# Patient Record
Sex: Female | Born: 1995 | Race: White | Hispanic: No | Marital: Single | Smoking: Current every day smoker
Health system: Southern US, Community
[De-identification: ages and names within clinical notes are randomized; demographics above are authoritative.]

## PROBLEM LIST (undated history)

## (undated) ENCOUNTER — Inpatient Hospital Stay: Payer: Self-pay

## (undated) DIAGNOSIS — Z789 Other specified health status: Secondary | ICD-10-CM

## (undated) HISTORY — PX: NO PAST SURGERIES: SHX2092

---
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
## 2016-07-26 NOTE — L&D Delivery Note (Signed)
Date of delivery: 02/24/17 Estimated Date of Delivery: 04/22/17 Patient's last menstrual period was 07/16/2016 (exact date). EGA: [id number]  Delivery Note At 7:54 AM a viable female was delivered via Vaginal, Spontaneous Delivery (Presentation: cephalic; direct OA).  APGAR: 8, 8; weight 4 lb 2 oz (1870 g).   Placenta status: spontaneous, intact.  Cord: 3vv  with the following complications: none apparent.  Cord pH: not collected  Anesthesia:  epidural Episiotomy: None Lacerations:  none Suture Repair: n/a Est. Blood Loss (mL):  42cc0 measured  Mom presented to L&D for induction of labor due to severe growth restriction.  Cervical ripening with cytotec, then augmented with pitocin.  epidual placed. Progressed to complete, second stage: <6310min, with delivery of fetal head without restitution.   Anterior then posterior shoulders delivered without difficulty.  Baby held below level of placenta for >60sec. She was then placed on mom's chest, and attended to by peds.  Cord was then clamped and cut by FOB.  Placenta spontaneously delivered, intact.   IV pitocin given for hemorrhage prophylaxis.  We sang happy birthday to baby Journey.  Mom to postpartum.  Baby to NICU (protocol due to weight)  Semir Brill C Madalena Kesecker 03/27/2017, 8:22 AM

## 2016-09-24 ENCOUNTER — Emergency Department
Admission: EM | Admit: 2016-09-24 | Discharge: 2016-09-24 | Disposition: A | Discharge status: Home / Self Care | Payer: Medicaid Other | Attending: Emergency Medicine | Admitting: Emergency Medicine

## 2016-09-24 ENCOUNTER — Emergency Department: Payer: Medicaid Other

## 2016-09-24 ENCOUNTER — Encounter: Payer: Self-pay | Admitting: Emergency Medicine

## 2016-09-24 DIAGNOSIS — Z3A09 9 weeks gestation of pregnancy: Secondary | ICD-10-CM | POA: Diagnosis not present

## 2016-09-24 DIAGNOSIS — R1032 Left lower quadrant pain: Secondary | ICD-10-CM

## 2016-09-24 DIAGNOSIS — Z79899 Other long term (current) drug therapy: Secondary | ICD-10-CM | POA: Diagnosis not present

## 2016-09-24 DIAGNOSIS — F17210 Nicotine dependence, cigarettes, uncomplicated: Secondary | ICD-10-CM | POA: Insufficient documentation

## 2016-09-24 DIAGNOSIS — O99331 Smoking (tobacco) complicating pregnancy, first trimester: Secondary | ICD-10-CM | POA: Insufficient documentation

## 2016-09-24 DIAGNOSIS — O26891 Other specified pregnancy related conditions, first trimester: Secondary | ICD-10-CM | POA: Diagnosis present

## 2016-09-24 DIAGNOSIS — N390 Urinary tract infection, site not specified: Secondary | ICD-10-CM

## 2016-09-24 DIAGNOSIS — Z791 Long term (current) use of non-steroidal anti-inflammatories (NSAID): Secondary | ICD-10-CM | POA: Diagnosis not present

## 2016-09-24 DIAGNOSIS — O2341 Unspecified infection of urinary tract in pregnancy, first trimester: Secondary | ICD-10-CM | POA: Insufficient documentation

## 2016-09-24 DIAGNOSIS — R102 Pelvic and perineal pain: Secondary | ICD-10-CM | POA: Diagnosis not present

## 2016-09-24 DIAGNOSIS — Z3490 Encounter for supervision of normal pregnancy, unspecified, unspecified trimester: Secondary | ICD-10-CM

## 2016-09-24 LAB — HCG, QUANTITATIVE, PREGNANCY: hCG, Beta Chain, Quant, S: 123464 m[IU]/mL — ABNORMAL HIGH (ref <5)

## 2016-09-24 LAB — URINALYSIS, COMPLETE (UACMP) WITH MICROSCOPIC
BACTERIA UA: NONE SEEN
BILIRUBIN URINE: NEGATIVE
Glucose, UA: NEGATIVE mg/dL
HGB URINE DIPSTICK: NEGATIVE
Ketones, ur: NEGATIVE mg/dL
Nitrite: NEGATIVE
PROTEIN: NEGATIVE mg/dL
RBC / HPF: NONE SEEN RBC/hpf (ref 0–5)
SPECIFIC GRAVITY, URINE: 1.006 (ref 1.005–1.030)
pH: 7 (ref 5.0–8.0)

## 2016-09-24 LAB — POCT PREGNANCY, URINE: Preg Test, Ur: POSITIVE — AB

## 2016-09-24 MED ORDER — NITROFURANTOIN MONOHYD MACRO 100 MG PO CAPS: 100.0000 mg | ORAL_CAPSULE | Freq: Two times a day (BID) | ORAL | 0 refills | Status: AC

## 2016-09-24 NOTE — ED Provider Notes (Signed)
[facility] Emergency Department Provider Note    ____________________________________________   I have reviewed the triage vital signs and the nursing notes.   HISTORY  Chief Complaint Abdominal Pain   History limited by: Not Limited   HPI Holly Watson is a 21 y.o. female who presents to the emergency department today because of concerns for left adnexal pain in the setting of early pregnancy. Patient had her last menstrual cycle and the end of December. States that she first took a positive pregnancy test two weeks ago. For the past few days has been having left lower quadrant pain. She has not had any vaginal bleeding or abnormal vaginal discharge. She denies any dysuria or change in urination. Sent from PCP for concern for ectopic.    History reviewed. No pertinent past medical history.  Patient Active Problem List   Diagnosis Date Noted  . Oligohydramnios 07/10/2015  . Poor fetal growth affecting management of mother in third trimester, antepartum 06/30/2015  . Indication for care in labor and delivery, antepartum 06/29/2015  . Labor and delivery indication for care or intervention 06/27/2015    History reviewed. No pertinent surgical history.  Prior to Admission medications   Medication Sig Start Date End Date Taking? Authorizing Provider  clindamycin (CLEOCIN) 300 MG capsule Take 1 capsule (300 mg total) by mouth 3 (three) times daily. 07/23/15   Rebecka Apley, MD  ibuprofen (ADVIL,MOTRIN) 600 MG tablet Take 1 tablet (600 mg total) by mouth every 6 (six) hours. 07/12/15   Karena Addison, CNM  oxyCODONE-acetaminophen (PERCOCET/ROXICET) 5-325 MG tablet Take 1 tablet by mouth every 4 (four) hours as needed (for pain scale 4-7). 07/12/15   Karena Addison, CNM  oxyCODONE-acetaminophen (ROXICET) 5-325 MG tablet Take 1 tablet by mouth every 6 (six) hours as needed. 07/23/15   Rebecka Apley, MD  Prenatal Vit-Fe Fumarate-FA (PRENATAL  MULTIVITAMIN) TABS tablet Take 1 tablet by mouth daily at 12 noon.    Historical Provider, MD    Allergies Patient has no known allergies.  No family history on file.  Social History Social History  Substance Use Topics  . Smoking status: Current Every Day Smoker    Packs/day: 0.50    Types: Cigarettes  . Smokeless tobacco: Never Used  . Alcohol use No    Review of Systems  Constitutional: Negative for fever. Cardiovascular: Negative for chest pain. Respiratory: Negative for shortness of breath. Gastrointestinal: Positive for LLQ pain. Genitourinary: Negative for dysuria. Musculoskeletal: Negative for back pain. Skin: Negative for rash. Neurological: Negative for headaches, focal weakness or numbness.  10-point ROS otherwise negative.  ____________________________________________   PHYSICAL EXAM:  VITAL SIGNS: ED Triage Vitals [09/24/16 1428]  Enc Vitals Group     BP 112/77     Pulse Rate 90     Resp 18     Temp 98.7 F (37.1 C)     Temp Source Oral     SpO2 100 %     Weight 130 lb (59 kg)     Height 5\' 3"  (1.6 m)     Head Circumference     Constitutional: Alert and oriented. Well appearing and in no distress. Eyes: Conjunctivae are normal. Normal extraocular movements. ENT   Head: Normocephalic and atraumatic.   Nose: No congestion/rhinnorhea.   Mouth/Throat: Mucous membranes are moist.   Neck: No stridor. Hematological/Lymphatic/Immunilogical: No cervical lymphadenopathy. Cardiovascular: Normal rate, regular rhythm.  No murmurs, rubs, or gallops.  Respiratory: Normal respiratory effort without tachypnea  nor retractions. Breath sounds are clear and equal bilaterally. No wheezes/rales/rhonchi. Gastrointestinal: Soft and minimally tender in the LLQ. No rebound. No guarding. Genitourinary: Deferred Musculoskeletal: Normal range of motion in all extremities. No lower extremity edema. Neurologic:  Normal speech and language. No gross focal  neurologic deficits are appreciated.  Skin:  Skin is warm, dry and intact. No rash noted. Psychiatric: Mood and affect are normal. Speech and behavior are normal. Patient exhibits appropriate insight and judgment.  ____________________________________________    LABS (pertinent positives/negatives)  Labs Reviewed  URINALYSIS, COMPLETE (UACMP) WITH MICROSCOPIC - Abnormal; Notable for the following:       Result Value   Color, Urine STRAW (*)    APPearance CLEAR (*)    Leukocytes, UA TRACE (*)    Squamous Epithelial / LPF 6-30 (*)    All other components within normal limits  HCG, QUANTITATIVE, PREGNANCY - Abnormal; Notable for the following:    hCG, Beta Chain, Quant, S [id number] (*)    All other components within normal limits  POCT PREGNANCY, URINE - Abnormal; Notable for the following:    Preg Test, Ur POSITIVE (*)    All other components within normal limits     ____________________________________________   EKG  None  ____________________________________________    RADIOLOGY  US IMPRESSION:  1. Single living intrauterine pregnancy as above.  2. No evidence of ovarian torsion.     ____________________________________________   PROCEDURES  Procedures  ____________________________________________   INITIAL IMPRESSION / ASSESSMENT AND PLAN / ED COURSE  Pertinent labs & imaging results that were available during my care of the patient were reviewed by me and considered in my medical decision making (see chart for details).  Patient presented to the emergency department today because of concerns for left lower abdominal pain in the setting of early pregnancy. Ultrasound shows a single live intrauterine pregnancy. The patient's urine however did have some elevation of white blood cells raising concern for possible urinary tract infection. Will plan on starting patient on antibiotics. Did discuss importance of starting prenatal vitamins with  patient.  ____________________________________________   FINAL CLINICAL IMPRESSION(S) / ED DIAGNOSES  Final diagnoses:  Adnexal pain  Lower urinary tract infectious disease  Pregnancy, unspecified gestational age     Note: This dictation was prepared with Nurse, children'sDragon dictation. Any transcriptional errors that result from this process are unintentional     Phineas SemenGraydon Neka Bise, MD 09/24/16 1709

## 2016-09-24 NOTE — ED Triage Notes (Signed)
Pt complains of sharp pains to lower abdomen on left side for 3 to 4 days. Pt states she was seen at urgent care earlier today and was told she was pregnant. Pt states they wanted her to follow up for an ultrasound because of the tenderness to her abdomen.

## 2016-09-24 NOTE — Discharge Instructions (Signed)
Please start taking prenatal vitamins. Please seek medical attention for any high fevers, chest pain, shortness of breath, change in behavior, persistent vomiting, bloody stool or any other new or concerning symptoms.  

## 2017-01-11 ENCOUNTER — Other Ambulatory Visit: Payer: Self-pay | Admitting: Obstetrics and Gynecology

## 2017-01-11 DIAGNOSIS — Z3689 Encounter for other specified antenatal screening: Secondary | ICD-10-CM

## 2017-01-17 ENCOUNTER — Other Ambulatory Visit: Payer: Self-pay | Admitting: *Deleted

## 2017-01-20 ENCOUNTER — Ambulatory Visit: Payer: Medicaid Other

## 2017-01-20 ENCOUNTER — Ambulatory Visit
Admission: RE | Admit: 2017-01-20 | Discharge: 2017-01-20 | Disposition: A | Discharge status: Home / Self Care | Payer: Medicaid Other | Source: Ambulatory Visit | Attending: Obstetrics and Gynecology | Admitting: Obstetrics and Gynecology

## 2017-01-20 ENCOUNTER — Other Ambulatory Visit: Payer: Self-pay | Admitting: *Deleted

## 2017-01-20 DIAGNOSIS — Z3689 Encounter for other specified antenatal screening: Secondary | ICD-10-CM

## 2017-01-20 DIAGNOSIS — O365990 Maternal care for other known or suspected poor fetal growth, unspecified trimester, not applicable or unspecified: Secondary | ICD-10-CM | POA: Diagnosis present

## 2017-01-20 DIAGNOSIS — Z3A26 26 weeks gestation of pregnancy: Secondary | ICD-10-CM | POA: Insufficient documentation

## 2017-01-20 DIAGNOSIS — F17210 Nicotine dependence, cigarettes, uncomplicated: Secondary | ICD-10-CM

## 2017-01-20 DIAGNOSIS — O365930 Maternal care for other known or suspected poor fetal growth, third trimester, not applicable or unspecified: Secondary | ICD-10-CM

## 2017-01-20 DIAGNOSIS — O365920 Maternal care for other known or suspected poor fetal growth, second trimester, not applicable or unspecified: Secondary | ICD-10-CM

## 2017-01-27 ENCOUNTER — Other Ambulatory Visit: Payer: Self-pay | Admitting: *Deleted

## 2017-01-27 ENCOUNTER — Ambulatory Visit
Admission: RE | Admit: 2017-01-27 | Discharge: 2017-01-27 | Disposition: A | Discharge status: Home / Self Care | Payer: Medicaid Other | Source: Ambulatory Visit | Attending: Maternal & Fetal Medicine | Admitting: Maternal & Fetal Medicine

## 2017-01-27 DIAGNOSIS — O365920 Maternal care for other known or suspected poor fetal growth, second trimester, not applicable or unspecified: Secondary | ICD-10-CM

## 2017-01-31 ENCOUNTER — Ambulatory Visit
Admission: RE | Admit: 2017-01-31 | Discharge: 2017-01-31 | Disposition: A | Discharge status: Home / Self Care | Payer: Medicaid Other | Source: Ambulatory Visit | Attending: Obstetrics and Gynecology | Admitting: Obstetrics and Gynecology

## 2017-01-31 DIAGNOSIS — O365920 Maternal care for other known or suspected poor fetal growth, second trimester, not applicable or unspecified: Secondary | ICD-10-CM | POA: Insufficient documentation

## 2017-01-31 DIAGNOSIS — Z3A28 28 weeks gestation of pregnancy: Secondary | ICD-10-CM | POA: Diagnosis not present

## 2017-02-03 ENCOUNTER — Ambulatory Visit
Admission: RE | Admit: 2017-02-03 | Discharge: 2017-02-03 | Disposition: A | Discharge status: Home / Self Care | Payer: Medicaid Other | Source: Ambulatory Visit | Attending: Obstetrics and Gynecology | Admitting: Obstetrics and Gynecology

## 2017-02-03 DIAGNOSIS — O365920 Maternal care for other known or suspected poor fetal growth, second trimester, not applicable or unspecified: Secondary | ICD-10-CM | POA: Insufficient documentation

## 2017-02-03 DIAGNOSIS — Z3A28 28 weeks gestation of pregnancy: Secondary | ICD-10-CM | POA: Diagnosis not present

## 2017-02-07 ENCOUNTER — Other Ambulatory Visit: Payer: Self-pay | Admitting: *Deleted

## 2017-02-07 ENCOUNTER — Ambulatory Visit
Admission: RE | Admit: 2017-02-07 | Discharge: 2017-02-07 | Disposition: A | Discharge status: Home / Self Care | Payer: Medicaid Other | Source: Ambulatory Visit | Attending: Maternal & Fetal Medicine | Admitting: Maternal & Fetal Medicine

## 2017-02-07 ENCOUNTER — Other Ambulatory Visit: Payer: Self-pay | Admitting: Maternal & Fetal Medicine

## 2017-02-07 DIAGNOSIS — O365930 Maternal care for other known or suspected poor fetal growth, third trimester, not applicable or unspecified: Secondary | ICD-10-CM

## 2017-02-07 DIAGNOSIS — Z3A29 29 weeks gestation of pregnancy: Secondary | ICD-10-CM | POA: Insufficient documentation

## 2017-02-07 DIAGNOSIS — O365990 Maternal care for other known or suspected poor fetal growth, unspecified trimester, not applicable or unspecified: Secondary | ICD-10-CM | POA: Diagnosis present

## 2017-02-10 ENCOUNTER — Ambulatory Visit
Admission: RE | Admit: 2017-02-10 | Discharge: 2017-02-10 | Disposition: A | Discharge status: Home / Self Care | Payer: Medicaid Other | Source: Ambulatory Visit | Attending: Obstetrics & Gynecology | Admitting: Obstetrics & Gynecology

## 2017-02-10 ENCOUNTER — Other Ambulatory Visit: Payer: Self-pay | Admitting: *Deleted

## 2017-02-10 DIAGNOSIS — O99333 Smoking (tobacco) complicating pregnancy, third trimester: Secondary | ICD-10-CM | POA: Diagnosis not present

## 2017-02-10 DIAGNOSIS — O365930 Maternal care for other known or suspected poor fetal growth, third trimester, not applicable or unspecified: Secondary | ICD-10-CM

## 2017-02-10 DIAGNOSIS — Z3A29 29 weeks gestation of pregnancy: Secondary | ICD-10-CM | POA: Insufficient documentation

## 2017-02-10 DIAGNOSIS — O365920 Maternal care for other known or suspected poor fetal growth, second trimester, not applicable or unspecified: Secondary | ICD-10-CM

## 2017-02-10 DIAGNOSIS — F17210 Nicotine dependence, cigarettes, uncomplicated: Secondary | ICD-10-CM | POA: Diagnosis not present

## 2017-02-14 ENCOUNTER — Other Ambulatory Visit: Payer: Medicaid Other

## 2017-02-14 ENCOUNTER — Other Ambulatory Visit: Payer: Self-pay | Admitting: *Deleted

## 2017-02-14 DIAGNOSIS — O365930 Maternal care for other known or suspected poor fetal growth, third trimester, not applicable or unspecified: Secondary | ICD-10-CM

## 2017-02-17 ENCOUNTER — Other Ambulatory Visit: Payer: Self-pay | Admitting: *Deleted

## 2017-02-17 ENCOUNTER — Ambulatory Visit
Admission: RE | Admit: 2017-02-17 | Discharge: 2017-02-17 | Disposition: A | Discharge status: Home / Self Care | Payer: Medicaid Other | Source: Ambulatory Visit | Attending: Maternal & Fetal Medicine | Admitting: Maternal & Fetal Medicine

## 2017-02-17 DIAGNOSIS — O365930 Maternal care for other known or suspected poor fetal growth, third trimester, not applicable or unspecified: Secondary | ICD-10-CM

## 2017-02-21 ENCOUNTER — Ambulatory Visit
Admission: RE | Admit: 2017-02-21 | Discharge: 2017-02-21 | Disposition: A | Discharge status: Home / Self Care | Payer: Medicaid Other | Source: Ambulatory Visit | Attending: Maternal & Fetal Medicine | Admitting: Maternal & Fetal Medicine

## 2017-02-21 ENCOUNTER — Other Ambulatory Visit: Payer: Medicaid Other

## 2017-02-21 ENCOUNTER — Other Ambulatory Visit: Payer: Self-pay | Admitting: *Deleted

## 2017-02-21 DIAGNOSIS — O365930 Maternal care for other known or suspected poor fetal growth, third trimester, not applicable or unspecified: Secondary | ICD-10-CM

## 2017-02-21 DIAGNOSIS — Z3A31 31 weeks gestation of pregnancy: Secondary | ICD-10-CM | POA: Insufficient documentation

## 2017-02-21 DIAGNOSIS — O365939 Maternal care for other known or suspected poor fetal growth, third trimester, other fetus: Secondary | ICD-10-CM

## 2017-02-24 ENCOUNTER — Ambulatory Visit: Admission: RE | Admit: 2017-02-24 | Payer: Medicaid Other | Source: Ambulatory Visit

## 2017-02-24 ENCOUNTER — Other Ambulatory Visit: Payer: Self-pay | Admitting: *Deleted

## 2017-02-24 DIAGNOSIS — O365930 Maternal care for other known or suspected poor fetal growth, third trimester, not applicable or unspecified: Secondary | ICD-10-CM

## 2017-02-28 ENCOUNTER — Ambulatory Visit
Admission: RE | Admit: 2017-02-28 | Discharge: 2017-02-28 | Disposition: A | Discharge status: Home / Self Care | Payer: Medicaid Other | Source: Ambulatory Visit | Attending: Maternal & Fetal Medicine | Admitting: Maternal & Fetal Medicine

## 2017-02-28 ENCOUNTER — Other Ambulatory Visit: Payer: Self-pay | Admitting: *Deleted

## 2017-02-28 DIAGNOSIS — O365930 Maternal care for other known or suspected poor fetal growth, third trimester, not applicable or unspecified: Secondary | ICD-10-CM | POA: Diagnosis not present

## 2017-02-28 DIAGNOSIS — Z3A32 32 weeks gestation of pregnancy: Secondary | ICD-10-CM | POA: Insufficient documentation

## 2017-03-03 ENCOUNTER — Other Ambulatory Visit: Payer: Self-pay | Admitting: *Deleted

## 2017-03-03 ENCOUNTER — Ambulatory Visit
Admission: RE | Admit: 2017-03-03 | Discharge: 2017-03-03 | Disposition: A | Discharge status: Home / Self Care | Payer: Medicaid Other | Source: Ambulatory Visit | Attending: Obstetrics & Gynecology | Admitting: Obstetrics & Gynecology

## 2017-03-03 DIAGNOSIS — O365930 Maternal care for other known or suspected poor fetal growth, third trimester, not applicable or unspecified: Secondary | ICD-10-CM

## 2017-03-03 DIAGNOSIS — Z3A32 32 weeks gestation of pregnancy: Secondary | ICD-10-CM | POA: Diagnosis not present

## 2017-03-07 ENCOUNTER — Ambulatory Visit
Admission: RE | Admit: 2017-03-07 | Discharge: 2017-03-07 | Disposition: A | Discharge status: Home / Self Care | Payer: Medicaid Other | Source: Ambulatory Visit | Attending: Obstetrics and Gynecology | Admitting: Obstetrics and Gynecology

## 2017-03-07 ENCOUNTER — Other Ambulatory Visit: Payer: Self-pay | Admitting: *Deleted

## 2017-03-07 DIAGNOSIS — O365930 Maternal care for other known or suspected poor fetal growth, third trimester, not applicable or unspecified: Secondary | ICD-10-CM

## 2017-03-07 DIAGNOSIS — Z3A33 33 weeks gestation of pregnancy: Secondary | ICD-10-CM | POA: Diagnosis not present

## 2017-03-10 ENCOUNTER — Other Ambulatory Visit: Payer: Medicaid Other

## 2017-03-10 ENCOUNTER — Other Ambulatory Visit: Payer: Self-pay | Admitting: *Deleted

## 2017-03-10 DIAGNOSIS — O365930 Maternal care for other known or suspected poor fetal growth, third trimester, not applicable or unspecified: Secondary | ICD-10-CM

## 2017-03-12 ENCOUNTER — Observation Stay
Admission: EM | Admit: 2017-03-12 | Discharge: 2017-03-12 | Disposition: A | Discharge status: Home / Self Care | Payer: Medicaid Other | Attending: Obstetrics and Gynecology | Admitting: Obstetrics and Gynecology

## 2017-03-12 DIAGNOSIS — O26893 Other specified pregnancy related conditions, third trimester: Secondary | ICD-10-CM | POA: Diagnosis not present

## 2017-03-12 DIAGNOSIS — Z3A33 33 weeks gestation of pregnancy: Secondary | ICD-10-CM | POA: Diagnosis not present

## 2017-03-12 DIAGNOSIS — R252 Cramp and spasm: Secondary | ICD-10-CM | POA: Diagnosis present

## 2017-03-12 DIAGNOSIS — Z3490 Encounter for supervision of normal pregnancy, unspecified, unspecified trimester: Secondary | ICD-10-CM

## 2017-03-12 DIAGNOSIS — M545 Low back pain: Secondary | ICD-10-CM | POA: Diagnosis not present

## 2017-03-12 HISTORY — DX: Other specified health status: Z78.9

## 2017-03-12 LAB — URINALYSIS, ROUTINE W REFLEX MICROSCOPIC
Bilirubin Urine: NEGATIVE
GLUCOSE, UA: NEGATIVE mg/dL
HGB URINE DIPSTICK: NEGATIVE
Ketones, ur: NEGATIVE mg/dL
NITRITE: NEGATIVE
Protein, ur: 30 mg/dL — AB
SPECIFIC GRAVITY, URINE: 1.02 (ref 1.005–1.030)
pH: 7 (ref 5.0–8.0)

## 2017-03-12 MED ORDER — ACETAMINOPHEN 325 MG PO TABS
650.0000 mg | ORAL_TABLET | ORAL | Status: DC | PRN
  Administered 2017-03-12: 650 mg via ORAL
  Filled 2017-03-12: qty 2

## 2017-03-12 NOTE — OB Triage Note (Signed)
Pt is a 21yo G2P1 that presents c/o severe lower back pain and lower abdominal pain with pressure. Pain started around 1900 and states 5/10 scale. Pt denies VB and LOF and notes +FM.

## 2017-03-14 ENCOUNTER — Other Ambulatory Visit: Payer: Medicaid Other

## 2017-03-14 LAB — URINE CULTURE

## 2017-03-17 ENCOUNTER — Other Ambulatory Visit: Payer: Medicaid Other

## 2017-03-17 ENCOUNTER — Other Ambulatory Visit: Payer: Self-pay | Admitting: *Deleted

## 2017-03-17 DIAGNOSIS — O365930 Maternal care for other known or suspected poor fetal growth, third trimester, not applicable or unspecified: Secondary | ICD-10-CM

## 2017-03-18 NOTE — Progress Notes (Signed)
Patient ID: Holly Watson, female   DOB: 04-13-1996, 21 y.o.   MRN: [id number] 33 weeks with LBP and cramping .  Cx closed and reassuring fetal monitoring  D/c home with precautions

## 2017-03-18 NOTE — Discharge Summary (Signed)
  Schermerhorn, Ihor Austin, MD  Obstetrics    [] Hide copied text [] Hover for attribution information Patient ID: Holly Watson, female   DOB: [birth year], 21 y.o.   MRN: [id number] 33 weeks with LBP and cramping .  Cx closed and reassuring fetal monitoring  D/c home with precautions     Electronically signed by Schermerhorn, Ihor Austin, MD at 03/18/2017 7:40 AM

## 2017-03-21 ENCOUNTER — Inpatient Hospital Stay: Admission: RE | Admit: 2017-03-21 | Payer: Medicaid Other | Source: Ambulatory Visit

## 2017-03-21 ENCOUNTER — Ambulatory Visit
Admission: RE | Admit: 2017-03-21 | Discharge: 2017-03-21 | Disposition: A | Discharge status: Home / Self Care | Payer: Medicaid Other | Source: Ambulatory Visit | Attending: Obstetrics & Gynecology | Admitting: Obstetrics & Gynecology

## 2017-03-21 DIAGNOSIS — O365930 Maternal care for other known or suspected poor fetal growth, third trimester, not applicable or unspecified: Secondary | ICD-10-CM | POA: Insufficient documentation

## 2017-03-21 DIAGNOSIS — Z3A35 35 weeks gestation of pregnancy: Secondary | ICD-10-CM | POA: Diagnosis not present

## 2017-03-24 ENCOUNTER — Other Ambulatory Visit: Payer: Self-pay | Admitting: *Deleted

## 2017-03-24 ENCOUNTER — Ambulatory Visit
Admission: RE | Admit: 2017-03-24 | Discharge: 2017-03-24 | Disposition: A | Discharge status: Home / Self Care | Payer: Medicaid Other | Source: Ambulatory Visit | Attending: Maternal & Fetal Medicine | Admitting: Maternal & Fetal Medicine

## 2017-03-24 ENCOUNTER — Observation Stay
Admission: AD | Admit: 2017-03-24 | Discharge: 2017-03-24 | Disposition: A | Discharge status: Home / Self Care | Payer: Medicaid Other | Source: Ambulatory Visit | Attending: Obstetrics and Gynecology | Admitting: Obstetrics and Gynecology

## 2017-03-24 DIAGNOSIS — O365930 Maternal care for other known or suspected poor fetal growth, third trimester, not applicable or unspecified: Secondary | ICD-10-CM

## 2017-03-24 DIAGNOSIS — Z3A35 35 weeks gestation of pregnancy: Secondary | ICD-10-CM

## 2017-03-24 DIAGNOSIS — O365920 Maternal care for other known or suspected poor fetal growth, second trimester, not applicable or unspecified: Secondary | ICD-10-CM

## 2017-03-24 MED ORDER — BETAMETHASONE SOD PHOS & ACET 6 (3-3) MG/ML IJ SUSP
12.0000 mg | INTRAMUSCULAR | Status: DC
  Administered 2017-03-24: 12 mg via INTRAMUSCULAR

## 2017-03-24 MED ORDER — BETAMETHASONE SOD PHOS & ACET 6 (3-3) MG/ML IJ SUSP
INTRAMUSCULAR | Status: AC
  Filled 2017-03-24: qty 1

## 2017-03-24 NOTE — H&P (Addendum)
OB ADMISSION/ HISTORY & PHYSICAL:  Admission Date: 03/24/2017 12:12 PM  Admit Diagnosis: Fetal growth restriction  Holly Watson is a 21 y.o. female G2P0101 at 5836+1 presenting for induction of labor. Pregnancy complicated by fetal growth restriction and elevated umbilical artery dopplers (>97.5%), which have been stable. She was first seen at [facility] for FGR on 01/20/17 at 26+4 and has been followed since that visit.  Ultrasound today found a single live pregnancy with an EFW of 1790g, <3% (3#15oz). Recent fetal growth velocity has decreased, though the elevated dopplers are stable. Anatomy is normal. BPP 8/8.  She received BMZ x2 over the last two days.   Prenatal History: G2P0101   EDC : 04/22/2017, by Patient Reported  [facility]  Prenatal Labs: ABO, Rh:   O pos O+ Ab screen Negative G/C Neg/Neg HIV Negative Hep B/RPR Negative/Non reactive  Rubella Immune VZV Immune  GTT: 94 GBS:   drawn 03/24/17  Medical / Surgical History :  Past medical history:  Past Medical History:  Diagnosis Date  . Medical history non-contributory      Past surgical history:  Past Surgical History:  Procedure Laterality Date  . NO PAST SURGERIES      Family History: No family history on file.   Social History:  reports that she has been smoking Cigarettes.  She has been smoking about 0.25 packs per day. She has never used smokeless tobacco. She reports that she uses drugs, including Marijuana. She reports that she does not drink alcohol.   Allergies: Patient has no known allergies.    Current Medications at time of admission:  Prior to Admission medications   Medication Sig Start Date End Date Taking? Authorizing Provider  aspirin 81 MG chewable tablet Chew 81 mg by mouth daily.    [provider]  Prenatal Vit-Fe Fumarate-FA (PRENATAL MULTIVITAMIN) TABS tablet Take 1 tablet by mouth daily at 12 noon.    [provider]     Physical Exam:  VS:  Blood pressure 102/64, pulse 66, temperature 98 F (36.7 C), temperature source Oral, resp. rate 19, height 5\' 3"  (1.6 m), weight 138 lb (62.6 kg), not currently breastfeeding.  Genitalia / VE: Dilation: Fingertip Effacement (%): 70 Station: -3, +1 Exam by:: Dr.Rishita Petron   Assessment: 36+[redacted] weeks gestation, fetal growth restriction FHR category 1  Plan:  Admit for induction labor Labs pending Epidural when desired Continuous fetal monitoring   1. Fetal Well being  - Fetal Tracing: 1 - Ultrasound:  reviewed, as above - Group B Streptococcus: pending - Presentation: vtx confirmed by u/s   2. Routine OB: - Prenatal labs reviewed, as above - Rh positive  3. Induction of Labor:  -  Contractions external toco in place -  Pelvis proven to 3#15oz -  Plan for induction with cytotec  4. Post Partum Planning: - Infant feeding: breast - Contraception: IUD

## 2017-03-24 NOTE — Discharge Instructions (Signed)
Labor Induction Labor induction is when steps are taken to cause a pregnant woman to begin the labor process. Most women go into labor on their own between 37 weeks and 42 weeks of the pregnancy. When this does not happen or when there is a medical need, methods may be used to induce labor. Labor induction causes a pregnant woman's uterus to contract. It also causes the cervix to soften (ripen), open (dilate), and thin out (efface). Usually, labor is not induced before 39 weeks of the pregnancy unless there is a problem with the baby or mother. Before inducing labor, your health care provider will consider a number of factors, including the following:  The medical condition of you and the baby.  How many weeks along you are.  The status of the babys lung maturity.  The condition of the cervix.  The position of the baby.  What are the reasons for labor induction? Labor may be induced for the following reasons:  The health of the baby or mother is at risk.  The pregnancy is overdue by 1 week or more.  The water breaks but labor does not start on its own.  The mother has a health condition or serious illness, such as high blood pressure, infection, placental abruption, or diabetes.  The amniotic fluid amounts are low around the baby.  The baby is distressed.  Convenience or wanting the baby to be born on a certain date is not a reason for inducing labor. What methods are used for labor induction? Several methods of labor induction may be used, such as:  Prostaglandin medicine. This medicine causes the cervix to dilate and ripen. The medicine will also start contractions. It can be taken by mouth or by inserting a suppository into the vagina.  Inserting a thin tube (catheter) with a balloon on the end into the vagina to dilate the cervix. Once inserted, the balloon is expanded with water, which causes the cervix to open.  Stripping the membranes. Your health care provider separates  amniotic sac tissue from the cervix, causing the cervix to be stretched and causing the release of a hormone called progesterone. This may cause the uterus to contract. It is often done during an office visit. You will be sent home to wait for the contractions to begin. You will then come in for an induction.  Breaking the water. Your health care provider makes a hole in the amniotic sac using a small instrument. Once the amniotic sac breaks, contractions should begin. This may still take hours to see an effect.  Medicine to trigger or strengthen contractions. This medicine is given through an IV access tube inserted into a vein in your arm.  All of the methods of induction, besides stripping the membranes, will be done in the hospital. Induction is done in the hospital so that you and the baby can be carefully monitored. How long does it take for labor to be induced? Some inductions can take up to 2-3 days. Depending on the cervix, it usually takes less time. It takes longer when you are induced early in the pregnancy or if this is your first pregnancy. If a mother is still pregnant and the induction has been going on for 2-3 days, either the mother will be sent home or a cesarean delivery will be needed. What are the risks associated with labor induction? Some of the risks of induction include:  Changes in fetal heart rate, such as too high, too low, or erratic.  Fetal distress.  Chance of infection for the mother and baby.  Increased chance of having a cesarean delivery.  Breaking off (abruption) of the placenta from the uterus (rare).  Uterine rupture (very rare).  When induction is needed for medical reasons, the benefits of induction may outweigh the risks. What are some reasons for not inducing labor? Labor induction should not be done if:  It is shown that your baby does not tolerate labor.  You have had previous surgeries on your uterus, such as a myomectomy or the removal of  fibroids.  Your placenta lies very low in the uterus and blocks the opening of the cervix (placenta previa).  Your baby is not in a head-down position.  The umbilical cord drops down into the birth canal in front of the baby. This could cut off the baby's blood and oxygen supply.  You have had a previous cesarean delivery.  There are unusual circumstances, such as the baby being extremely premature.  This information is not intended to replace advice given to you by your health care provider. Make sure you discuss any questions you have with your health care provider. Document Released: 12/01/2006 Document Revised: 12/18/2015 Document Reviewed: 02/08/2013 Elsevier Interactive Patient Education  2017 [employer].   Labor Induction Labor induction is when steps are taken to cause a pregnant woman to begin the labor process. Most women go into labor on their own between 37 weeks and 42 weeks of the pregnancy. When this does not happen or when there is a medical need, methods may be used to induce labor. Labor induction causes a pregnant woman's uterus to contract. It also causes the cervix to soften (ripen), open (dilate), and thin out (efface). Usually, labor is not induced before 39 weeks of the pregnancy unless there is a problem with the baby or mother. Before inducing labor, your health care provider will consider a number of factors, including the following:  The medical condition of you and the baby.  How many weeks along you are.  The status of the babys lung maturity.  The condition of the cervix.  The position of the baby.  What are the reasons for labor induction? Labor may be induced for the following reasons:  The health of the baby or mother is at risk.  The pregnancy is overdue by 1 week or more.  The water breaks but labor does not start on its own.  The mother has a health condition or serious illness, such as high blood pressure, infection, placental  abruption, or diabetes.  The amniotic fluid amounts are low around the baby.  The baby is distressed.  Convenience or wanting the baby to be born on a certain date is not a reason for inducing labor. What methods are used for labor induction? Several methods of labor induction may be used, such as:  Prostaglandin medicine. This medicine causes the cervix to dilate and ripen. The medicine will also start contractions. It can be taken by mouth or by inserting a suppository into the vagina.  Inserting a thin tube (catheter) with a balloon on the end into the vagina to dilate the cervix. Once inserted, the balloon is expanded with water, which causes the cervix to open.  Stripping the membranes. Your health care provider separates amniotic sac tissue from the cervix, causing the cervix to be stretched and causing the release of a hormone called progesterone. This may cause the uterus to contract. It is often done during an office visit.  You will be sent home to wait for the contractions to begin. You will then come in for an induction.  Breaking the water. Your health care provider makes a hole in the amniotic sac using a small instrument. Once the amniotic sac breaks, contractions should begin. This may still take hours to see an effect.  Medicine to trigger or strengthen contractions. This medicine is given through an IV access tube inserted into a vein in your arm.  All of the methods of induction, besides stripping the membranes, will be done in the hospital. Induction is done in the hospital so that you and the baby can be carefully monitored. How long does it take for labor to be induced? Some inductions can take up to 2-3 days. Depending on the cervix, it usually takes less time. It takes longer when you are induced early in the pregnancy or if this is your first pregnancy. If a mother is still pregnant and the induction has been going on for 2-3 days, either the mother will be sent home or  a cesarean delivery will be needed. What are the risks associated with labor induction? Some of the risks of induction include:  Changes in fetal heart rate, such as too high, too low, or erratic.  Fetal distress.  Chance of infection for the mother and baby.  Increased chance of having a cesarean delivery.  Breaking off (abruption) of the placenta from the uterus (rare).  Uterine rupture (very rare).  When induction is needed for medical reasons, the benefits of induction may outweigh the risks. What are some reasons for not inducing labor? Labor induction should not be done if:  It is shown that your baby does not tolerate labor.  You have had previous surgeries on your uterus, such as a myomectomy or the removal of fibroids.  Your placenta lies very low in the uterus and blocks the opening of the cervix (placenta previa).  Your baby is not in a head-down position.  The umbilical cord drops down into the birth canal in front of the baby. This could cut off the baby's blood and oxygen supply.  You have had a previous cesarean delivery.  There are unusual circumstances, such as the baby being extremely premature.  This information is not intended to replace advice given to you by your health care provider. Make sure you discuss any questions you have with your health care provider. Document Released: 12/01/2006 Document Revised: 12/18/2015 Document Reviewed: 02/08/2013 Elsevier Interactive Patient Education  2017 [employer]  Intrauterine Growth Restriction Intrauterine growth restriction (IUGR) is when your baby is not growing normally during your pregnancy. A baby with IUGR is smaller than it should be and may weigh less than normal at birth. IUGR can result if there is a problem with the organ that supplies your baby with oxygen and nutrition (placenta). Usually, there is no way to prevent this type of problem. What are the causes? The most common cause of IUGR is a  problem with the placenta or umbilical cord that causes your developing baby to get less oxygen or nutrition than needed. Other causes include:  Poor maternal nutrition.  Chemicals found in substances such as cigarettes, alcohol, and some illegal drugs.  Some prescription medicines.  Congenital defects.  Genetic disorders.  An infection.  Carrying more than one baby, such as twins or triplets (multiple gestations).  What increases the risk? This condition is more likely to develop in women who:  Are over the age of  35 at the time of delivery (advanced maternal age).  Have medical conditions such as high blood pressure, diabetes, heart or kidney disease, anemia, or conditions that increase the risk for blood clotting.  Live at a very high altitude during pregnancy.  Have a personal history or family history of IUGR.  Take medicines during pregnancy that are linked with congenital disabilities.  Have a personal or family history of a genetic disorder.  Come into contact with infected cat feces (toxoplasmosis).  Come into contact with chickenpox (varicella) or [place] measles (rubella).  Have or are at risk of getting an infectious disease such as syphilis, HIV, or herpes.  Eat poorly during their pregnancy.  Weigh less than 100 pounds.  Have a family history of multiple gestations.  Have had infertility treatments.  Use tobacco, illegal drugs, or drink alcohol during pregnancy.  What are the signs or symptoms? IUGR does not cause many symptoms. You might notice that your baby does not move or kick very often. Also, your belly may not be as big as expected for the stage of your pregnancy. How is this diagnosed? This condition is diagnosed with physical and prenatal exams. Your health care provider will measure the size of your baby inside your womb during a routine screening exam using sound waves (ultrasound). Your health care provider will compare the size of your baby  to the size of other babies at the same stage of development (gestational age). Your health care provider will diagnose IUGR if your baby is smaller than 90 percent of all other babies at the same gestational age. You may also have tests to find the cause of IUGR. These can include:  Having fluid removed from your womb to check for signs of infection or a congenital disability (amniocentesis).  A series of tests to monitor your baby's health (fetal monitoring).  How is this treated? In most cases, treatment for this condition focuses on stopping the cause of your babys small growth. Your health care providers will monitor your pregnancy closely and help you manage your pregnancy. If this condition is caused by a placenta problem and the baby is not getting enough blood, treatment may include:  Medicine to start labor and deliver your baby early (induction).  Cesarean delivery. In this procedure, your baby is delivered through a cut (incision) in the abdomen and womb (uterus).  Follow these instructions at home:  Make sure you are eating enough calories and gaining enough weight.  Eat a balanced diet. Work with a Dealer (dietitian), if needed.  Rest as needed. Try to get at least eight hours of sleep every night.  Do not drink alcohol.  Do not use illegal drugs.  Do not use any tobacco products, including cigarettes, chewing tobacco, or e-cigarettes. If you need help quitting, ask your health care provider.  Talk to your health care provider about steps you can take to avoid infections.  Take medicines, vitamins, and mineral supplements only as directed by your health care provider. Make sure that your health care provider knows about all of the prescribed or over-the-counter medicines, supplements, vitamins, eye drops, and creams that you are using.  Keep all follow-up visits as directed by your health care provider. This is important. Contact a health care provider  if:  Your baby is not moving as often as before. This information is not intended to replace advice given to you by your health care provider. Make sure you discuss any questions you have with your  health care provider. Document Released: 04/20/2008 Document Revised: 12/18/2015 Document Reviewed: 07/08/2014 Elsevier Interactive Patient Education  2018 [employer].  Intrauterine Growth Restriction Intrauterine growth restriction (IUGR) is when your baby is not growing normally during your pregnancy. A baby with IUGR is smaller than it should be and may weigh less than normal at birth. IUGR can result if there is a problem with the organ that supplies your baby with oxygen and nutrition (placenta). Usually, there is no way to prevent this type of problem. What are the causes? The most common cause of IUGR is a problem with the placenta or umbilical cord that causes your developing baby to get less oxygen or nutrition than needed. Other causes include:  Poor maternal nutrition.  Chemicals found in substances such as cigarettes, alcohol, and some illegal drugs.  Some prescription medicines.  Congenital defects.  Genetic disorders.  An infection.  Carrying more than one baby, such as twins or triplets (multiple gestations).  What increases the risk? This condition is more likely to develop in women who:  Are over the age of 2 at the time of delivery (advanced maternal age).  Have medical conditions such as high blood pressure, diabetes, heart or kidney disease, anemia, or conditions that increase the risk for blood clotting.  Live at a very high altitude during pregnancy.  Have a personal history or family history of IUGR.  Take medicines during pregnancy that are linked with congenital disabilities.  Have a personal or family history of a genetic disorder.  Come into contact with infected cat feces (toxoplasmosis).  Come into contact with chickenpox (varicella) or [place]  measles (rubella).  Have or are at risk of getting an infectious disease such as syphilis, HIV, or herpes.  Eat poorly during their pregnancy.  Weigh less than 100 pounds.  Have a family history of multiple gestations.  Have had infertility treatments.  Use tobacco, illegal drugs, or drink alcohol during pregnancy.  What are the signs or symptoms? IUGR does not cause many symptoms. You might notice that your baby does not move or kick very often. Also, your belly may not be as big as expected for the stage of your pregnancy. How is this diagnosed? This condition is diagnosed with physical and prenatal exams. Your health care provider will measure the size of your baby inside your womb during a routine screening exam using sound waves (ultrasound). Your health care provider will compare the size of your baby to the size of other babies at the same stage of development (gestational age). Your health care provider will diagnose IUGR if your baby is smaller than 90 percent of all other babies at the same gestational age. You may also have tests to find the cause of IUGR. These can include:  Having fluid removed from your womb to check for signs of infection or a congenital disability (amniocentesis).  A series of tests to monitor your baby's health (fetal monitoring).  How is this treated? In most cases, treatment for this condition focuses on stopping the cause of your babys small growth. Your health care providers will monitor your pregnancy closely and help you manage your pregnancy. If this condition is caused by a placenta problem and the baby is not getting enough blood, treatment may include:  Medicine to start labor and deliver your baby early (induction).  Cesarean delivery. In this procedure, your baby is delivered through a cut (incision) in the abdomen and womb (uterus).  Follow these instructions at home:  Make sure you are eating enough calories and gaining enough  weight.  Eat a balanced diet. Work with a Dealer (dietitian), if needed.  Rest as needed. Try to get at least eight hours of sleep every night.  Do not drink alcohol.  Do not use illegal drugs.  Do not use any tobacco products, including cigarettes, chewing tobacco, or e-cigarettes. If you need help quitting, ask your health care provider.  Talk to your health care provider about steps you can take to avoid infections.  Take medicines, vitamins, and mineral supplements only as directed by your health care provider. Make sure that your health care provider knows about all of the prescribed or over-the-counter medicines, supplements, vitamins, eye drops, and creams that you are using.  Keep all follow-up visits as directed by your health care provider. This is important. Contact a health care provider if:  Your baby is not moving as often as before. This information is not intended to replace advice given to you by your health care provider. Make sure you discuss any questions you have with your health care provider. Document Released: 04/20/2008 Document Revised: 12/18/2015 Document Reviewed: 07/08/2014 Elsevier Interactive Patient Education  [employer].

## 2017-03-24 NOTE — OB Triage Note (Signed)
[facility] for Betamethazone shot and GBS culture.

## 2017-03-24 NOTE — Discharge Summary (Signed)
Patient discharged home in stable, ambulatory condition.  Discharge instructions reviewed, follow ups confirmed.  Patient verbalized understanding.

## 2017-03-24 NOTE — Discharge Summary (Signed)
TRIAGE VISIT with NST   Holly Watson is a 21 y.o. G2P0101. She is at 2850w6d gestation, complicated by fetal growth restriction and elevated umbilical artery dopplers (>97.5%), which have been stable. She was first seen at [facility] for FGR on 01/20/17 at 26+4 and has been followed since that visit.  Ultrasound today found a single live pregnancy with an EFW of 1790g, <3%. Recent fetal growth velocity has decreased, though the elevated dopplers are stable. Anatomy is normal. BPP 8/8.   S: Resting comfortably. no CTX, no VB. Active fetal movement. O:  Ht 5\' 3"  (1.6 m)   Wt 138 lb (62.6 kg)   BMI 24.45 kg/m  No results found for this or any previous visit (from the past 48 hour(s)).   Gen: NAD, AAOx3      Abd: FNTTP      Ext: Non-tender, Nonedmeatous    FHT: 130, +moderate variability, +accels, no decels TOCO: quiet SVE: Dilation: Fingertip Effacement (%): 70 Cervical Position: Posterior Station: -3, +1 Presentation: Vertex Exam by:: Dr.Feliberto Stockley   A/P:  21 y.o. [id number] 4050w6d with severe FGR and elevated umbilical artery dopplers, with reassuring fetal testing with BPP 8/8 today, but decreased velocity of fetal growth.  After discussion with MFM and patient, we have decided to proceed with BMZ x2, draw GBS today, and bring her back for IOL in 48-72 hrs. She is on the books to return Saturday night.

## 2017-03-25 ENCOUNTER — Observation Stay
Admission: RE | Admit: 2017-03-25 | Discharge: 2017-03-25 | Disposition: A | Discharge status: Home / Self Care | Payer: Medicaid Other | Source: Home / Self Care | Attending: Obstetrics & Gynecology | Admitting: Obstetrics & Gynecology

## 2017-03-25 DIAGNOSIS — O99333 Smoking (tobacco) complicating pregnancy, third trimester: Secondary | ICD-10-CM

## 2017-03-25 DIAGNOSIS — Z3A36 36 weeks gestation of pregnancy: Secondary | ICD-10-CM | POA: Insufficient documentation

## 2017-03-25 DIAGNOSIS — Z7982 Long term (current) use of aspirin: Secondary | ICD-10-CM

## 2017-03-25 DIAGNOSIS — O09893 Supervision of other high risk pregnancies, third trimester: Secondary | ICD-10-CM | POA: Insufficient documentation

## 2017-03-25 DIAGNOSIS — O368130 Decreased fetal movements, third trimester, not applicable or unspecified: Secondary | ICD-10-CM | POA: Insufficient documentation

## 2017-03-25 DIAGNOSIS — O365930 Maternal care for other known or suspected poor fetal growth, third trimester, not applicable or unspecified: Secondary | ICD-10-CM | POA: Insufficient documentation

## 2017-03-25 DIAGNOSIS — F17210 Nicotine dependence, cigarettes, uncomplicated: Secondary | ICD-10-CM | POA: Insufficient documentation

## 2017-03-25 LAB — CULTURE, BETA STREP (GROUP B ONLY)

## 2017-03-25 MED ORDER — BETAMETHASONE SOD PHOS & ACET 6 (3-3) MG/ML IJ SUSP
12.0000 mg | Freq: Once | INTRAMUSCULAR | Status: AC
  Administered 2017-03-25: 12 mg via INTRAMUSCULAR

## 2017-03-25 NOTE — OB Triage Note (Signed)
Patient presented to L&D for her 2nd Betamethazone injection. States she hasn't been feeling her baby move since this morning.  No leaking of fluid, no vaginal bleeding.

## 2017-03-25 NOTE — Discharge Summary (Signed)
Holly Watson is a 21 y.o. female. She is at 5347w0d gestation. Patient's last menstrual period was 07/16/2016 (exact date). Estimated Date of Delivery: 04/22/17   [facility]: [facility]   Chief Complaint: high risk pregnancy, need for antepartum surveillance  S: Resting comfortably. no CTX, no VB.no LOF,  decreased fetal movement today.  Hasn't felt the baby move regularly this morning; starting to resolve at presentation.  Holly Watson has FGR with elevated dopplers.  She is scheduled for induction after receipt of BMZ.  1st dose yesterday, second dose due today.  IOL scheduled tomorrow evening.   Maternal Medical History:   Past Medical History:  Diagnosis Date  . Medical history non-contributory     Past Surgical History:  Procedure Laterality Date  . NO PAST SURGERIES      No Known Allergies  Prior to Admission medications   Medication Sig Start Date End Date Taking? Authorizing Provider  aspirin 81 MG chewable tablet Chew 81 mg by mouth daily.    [provider]  Prenatal Vit-Fe Fumarate-FA (PRENATAL MULTIVITAMIN) TABS tablet Take 1 tablet by mouth daily at 12 noon.    [provider]     Social History: She  reports that she has been smoking Cigarettes.  She has been smoking about 0.25 packs per day. She has never used smokeless tobacco. She reports that she uses drugs, including Marijuana. She reports that she does not drink alcohol.  Family History:   no history of gyn cancers  Review of Systems: A full review of systems was performed and negative except as noted in the HPI.     O:  Ht 5\' 3"  (1.6 m)   Wt 138 lb (62.6 kg)   LMP 07/16/2016 (Exact Date)   BMI 24.45 kg/m  Results for orders placed or performed during the hospital encounter of 03/24/17 (from the past 48 hour(s))  Culture, beta strep (group b only)   Collection Time: 03/24/17  1:12 PM  Result Value Ref Range   Specimen Description VAGINAL/RECTAL    Special  Requests NONE    Culture (A)     GROUP B STREP(S.AGALACTIAE)ISOLATED TESTING AGAINST S. AGALACTIAE NOT ROUTINELY PERFORMED DUE TO PREDICTABILITY OF AMP/PEN/VAN SUSCEPTIBILITY. CRITICAL RESULT CALLED TO, READ BACK BY AND VERIFIED WITH: B AHLGREN,RN AT [id number] 03/25/17 BY L BENFIELD Performed at [facility], [address] [place], [place] [id number]    Report Status 03/25/2017 FINAL      Constitutional: NAD, AAOx3  HE/ENT: extraocular movements grossly intact, moist mucous membranes CV: RRR PULM: nl respiratory effort, CTABL     Abd: gravid, non-tender, non-distended, soft      Ext: Non-tender, Nonedmeatous   Psych: mood appropriate, speech normal Pelvic: deferred  Baseline: 120 Variability: moderate Accelerations present x >2 Decelerations absent Time 20mins    A/P: 21 y.o. 3047w0d with high risk pregnancy and antepartum surveillance.   Labor: not present.   Fetal Wellbeing: Reassuring Cat 1 tracing.  Reactive NST   Resolved fetal movement  BMZ delivered  D/c home stable, precautions reviewed, follow-up as scheduled tomorrow night for IOL.  ----- Ranae Plumberhelsea Ward, MD Attending Obstetrician and Gynecologist [facility], Department of OB/GYN [facility]

## 2017-03-26 ENCOUNTER — Inpatient Hospital Stay
Admission: EM | Admit: 2017-03-26 | Discharge: 2017-03-28 | DRG: 775 | Disposition: A | Discharge status: Home / Self Care | Payer: Medicaid Other | Attending: Obstetrics and Gynecology | Admitting: Obstetrics and Gynecology

## 2017-03-26 ENCOUNTER — Encounter: Payer: Self-pay | Admitting: *Deleted

## 2017-03-26 DIAGNOSIS — Z7982 Long term (current) use of aspirin: Secondary | ICD-10-CM | POA: Diagnosis not present

## 2017-03-26 DIAGNOSIS — F1290 Cannabis use, unspecified, uncomplicated: Secondary | ICD-10-CM | POA: Diagnosis present

## 2017-03-26 DIAGNOSIS — O365930 Maternal care for other known or suspected poor fetal growth, third trimester, not applicable or unspecified: Secondary | ICD-10-CM | POA: Diagnosis present

## 2017-03-26 DIAGNOSIS — O99324 Drug use complicating childbirth: Secondary | ICD-10-CM | POA: Diagnosis present

## 2017-03-26 DIAGNOSIS — O99824 Streptococcus B carrier state complicating childbirth: Secondary | ICD-10-CM | POA: Diagnosis present

## 2017-03-26 DIAGNOSIS — Z3A36 36 weeks gestation of pregnancy: Secondary | ICD-10-CM | POA: Diagnosis not present

## 2017-03-26 DIAGNOSIS — F17210 Nicotine dependence, cigarettes, uncomplicated: Secondary | ICD-10-CM | POA: Diagnosis present

## 2017-03-26 DIAGNOSIS — O99334 Smoking (tobacco) complicating childbirth: Secondary | ICD-10-CM | POA: Diagnosis present

## 2017-03-26 LAB — CBC
HEMATOCRIT: 28.8 % — AB (ref 35.0–47.0)
HEMOGLOBIN: 10 g/dL — AB (ref 12.0–16.0)
MCH: 28.7 pg (ref 26.0–34.0)
MCHC: 34.6 g/dL (ref 32.0–36.0)
MCV: 82.9 fL (ref 80.0–100.0)
Platelets: 265 10*3/uL (ref 150–440)
RBC: 3.47 MIL/uL — AB (ref 3.80–5.20)
RDW: 13.7 % (ref 11.5–14.5)
WBC: 16.8 10*3/uL — AB (ref 3.6–11.0)

## 2017-03-26 LAB — RAPID HIV SCREEN (HIV 1/2 AB+AG)
HIV 1/2 ANTIBODIES: NONREACTIVE
HIV-1 P24 ANTIGEN - HIV24: NONREACTIVE

## 2017-03-26 LAB — TYPE AND SCREEN
ABO/RH(D): O POS
ANTIBODY SCREEN: NEGATIVE

## 2017-03-26 MED ORDER — MISOPROSTOL 200 MCG PO TABS
ORAL_TABLET | ORAL | Status: AC
  Filled 2017-03-26: qty 3

## 2017-03-26 MED ORDER — OXYTOCIN 40 UNITS IN LACTATED RINGERS INFUSION - SIMPLE MED
2.5000 [IU]/h | INTRAVENOUS | Status: DC
  Filled 2017-03-26: qty 1000

## 2017-03-26 MED ORDER — ONDANSETRON HCL 4 MG/2ML IJ SOLN: 4.0000 mg | Freq: Four times a day (QID) | INTRAMUSCULAR | Status: DC | PRN

## 2017-03-26 MED ORDER — LIDOCAINE HCL (PF) 1 % IJ SOLN: 30.0000 mL | INTRAMUSCULAR | Status: DC | PRN

## 2017-03-26 MED ORDER — OXYTOCIN BOLUS FROM INFUSION
500.0000 mL | Freq: Once | INTRAVENOUS | Status: AC
  Administered 2017-03-27: 500 mL via INTRAVENOUS

## 2017-03-26 MED ORDER — ACETAMINOPHEN 325 MG PO TABS: 650.0000 mg | ORAL_TABLET | ORAL | Status: DC | PRN

## 2017-03-26 MED ORDER — LACTATED RINGERS IV SOLN
500.0000 mL | INTRAVENOUS | Status: DC | PRN
  Administered 2017-03-27: 500 mL via INTRAVENOUS

## 2017-03-26 MED ORDER — LIDOCAINE HCL (PF) 1 % IJ SOLN
INTRAMUSCULAR | Status: AC
  Filled 2017-03-26: qty 30

## 2017-03-26 MED ORDER — OXYTOCIN 40 UNITS IN LACTATED RINGERS INFUSION - SIMPLE MED
1.0000 m[IU]/min | INTRAVENOUS | Status: DC
  Administered 2017-03-27: 2 m[IU]/min via INTRAVENOUS
  Filled 2017-03-26: qty 1000

## 2017-03-26 MED ORDER — MISOPROSTOL 25 MCG QUARTER TABLET
25.0000 ug | ORAL_TABLET | Freq: Once | ORAL | Status: AC
  Administered 2017-03-26: 25 ug via ORAL

## 2017-03-26 MED ORDER — BUTORPHANOL TARTRATE 2 MG/ML IJ SOLN: 1.0000 mg | INTRAMUSCULAR | Status: DC | PRN

## 2017-03-26 MED ORDER — TERBUTALINE SULFATE 1 MG/ML IJ SOLN: 0.2500 mg | Freq: Once | INTRAMUSCULAR | Status: DC | PRN

## 2017-03-26 MED ORDER — OXYTOCIN 10 UNIT/ML IJ SOLN
INTRAMUSCULAR | Status: AC
  Filled 2017-03-26: qty 2

## 2017-03-26 MED ORDER — SOD CITRATE-CITRIC ACID 500-334 MG/5ML PO SOLN: 30.0000 mL | ORAL | Status: DC | PRN

## 2017-03-26 MED ORDER — SODIUM CHLORIDE 0.9 % IV SOLN
2.0000 g | Freq: Once | INTRAVENOUS | Status: AC
  Administered 2017-03-26: 2 g via INTRAVENOUS
  Filled 2017-03-26: qty 2000

## 2017-03-26 MED ORDER — AMMONIA AROMATIC IN INHA
RESPIRATORY_TRACT | Status: AC
  Filled 2017-03-26: qty 10

## 2017-03-26 MED ORDER — LACTATED RINGERS IV SOLN
INTRAVENOUS | Status: DC
  Administered 2017-03-26 – 2017-03-27 (×2): via INTRAVENOUS

## 2017-03-26 MED ORDER — MISOPROSTOL 25 MCG QUARTER TABLET
25.0000 ug | ORAL_TABLET | ORAL | Status: DC | PRN
  Filled 2017-03-26: qty 1

## 2017-03-27 ENCOUNTER — Inpatient Hospital Stay: Payer: Medicaid Other | Admitting: Anesthesiology

## 2017-03-27 ENCOUNTER — Encounter: Payer: Self-pay | Admitting: Anesthesiology

## 2017-03-27 LAB — CHLAMYDIA/NGC RT PCR (ARMC ONLY)
Chlamydia Tr: NOT DETECTED
N GONORRHOEAE: NOT DETECTED

## 2017-03-27 MED ORDER — LIDOCAINE HCL (PF) 2 % IJ SOLN
INTRAMUSCULAR | Status: DC | PRN
  Administered 2017-03-27: 4 mL via INTRADERMAL

## 2017-03-27 MED ORDER — ONDANSETRON HCL 4 MG PO TABS: 4.0000 mg | ORAL_TABLET | ORAL | Status: DC | PRN

## 2017-03-27 MED ORDER — EPHEDRINE 5 MG/ML INJ: 10.0000 mg | INTRAVENOUS | Status: DC | PRN

## 2017-03-27 MED ORDER — ONDANSETRON HCL 4 MG/2ML IJ SOLN: 4.0000 mg | INTRAMUSCULAR | Status: DC | PRN

## 2017-03-27 MED ORDER — LIDOCAINE HCL (PF) 1 % IJ SOLN
INTRAMUSCULAR | Status: DC | PRN
  Administered 2017-03-27: 3 mL

## 2017-03-27 MED ORDER — FENTANYL 2.5 MCG/ML W/ROPIVACAINE 0.15% IN NS 100 ML EPIDURAL (ARMC)
12.0000 mL/h | EPIDURAL | Status: DC
  Administered 2017-03-27: 12 mL/h via EPIDURAL

## 2017-03-27 MED ORDER — LIDOCAINE-EPINEPHRINE (PF) 1.5 %-1:200000 IJ SOLN
INTRAMUSCULAR | Status: DC | PRN
  Administered 2017-03-27: 3 mL via PERINEURAL

## 2017-03-27 MED ORDER — LACTATED RINGERS IV SOLN: 500.0000 mL | Freq: Once | INTRAVENOUS | Status: DC

## 2017-03-27 MED ORDER — DIPHENHYDRAMINE HCL 50 MG/ML IJ SOLN: 12.5000 mg | INTRAMUSCULAR | Status: DC | PRN

## 2017-03-27 MED ORDER — COCONUT OIL OIL: 1.0000 "application " | TOPICAL_OIL | Status: DC | PRN

## 2017-03-27 MED ORDER — DIPHENHYDRAMINE HCL 25 MG PO CAPS
25.0000 mg | ORAL_CAPSULE | Freq: Four times a day (QID) | ORAL | Status: DC | PRN
Start: 2017-03-27 — End: 2017-03-29

## 2017-03-27 MED ORDER — BUPIVACAINE HCL (PF) 0.25 % IJ SOLN
INTRAMUSCULAR | Status: DC | PRN
  Administered 2017-03-27: 10 mL via EPIDURAL

## 2017-03-27 MED ORDER — DIBUCAINE 1 % RE OINT: 1.0000 "application " | TOPICAL_OINTMENT | RECTAL | Status: DC | PRN

## 2017-03-27 MED ORDER — AMPICILLIN SODIUM 1 G IJ SOLR
1.0000 g | INTRAMUSCULAR | Status: DC
  Administered 2017-03-27 (×2): 1 g via INTRAVENOUS
  Filled 2017-03-27: qty 1000

## 2017-03-27 MED ORDER — IBUPROFEN 600 MG PO TABS
600.0000 mg | ORAL_TABLET | Freq: Four times a day (QID) | ORAL | Status: DC
  Administered 2017-03-27 – 2017-03-28 (×6): 600 mg via ORAL
  Filled 2017-03-27 (×6): qty 1

## 2017-03-27 MED ORDER — FENTANYL 2.5 MCG/ML W/ROPIVACAINE 0.15% IN NS 100 ML EPIDURAL (ARMC)
EPIDURAL | Status: AC
  Filled 2017-03-27: qty 100

## 2017-03-27 MED ORDER — DOCUSATE SODIUM 100 MG PO CAPS
100.0000 mg | ORAL_CAPSULE | Freq: Two times a day (BID) | ORAL | Status: DC
  Administered 2017-03-28 (×2): 100 mg via ORAL
  Filled 2017-03-27 (×2): qty 1

## 2017-03-27 MED ORDER — ACETAMINOPHEN 500 MG PO TABS: 1000.0000 mg | ORAL_TABLET | Freq: Four times a day (QID) | ORAL | Status: DC | PRN

## 2017-03-27 MED ORDER — PHENYLEPHRINE 40 MCG/ML (10ML) SYRINGE FOR IV PUSH (FOR BLOOD PRESSURE SUPPORT): 80.0000 ug | PREFILLED_SYRINGE | INTRAVENOUS | Status: DC | PRN

## 2017-03-27 MED ORDER — MISOPROSTOL 200 MCG PO TABS
ORAL_TABLET | ORAL | Status: AC
  Filled 2017-03-27: qty 2

## 2017-03-27 MED ORDER — SIMETHICONE 80 MG PO CHEW
80.0000 mg | CHEWABLE_TABLET | ORAL | Status: DC | PRN
Start: 2017-03-27 — End: 2017-03-29

## 2017-03-27 MED ORDER — WITCH HAZEL-GLYCERIN EX PADS: 1.0000 "application " | MEDICATED_PAD | CUTANEOUS | Status: DC

## 2017-03-27 MED ORDER — PRENATAL MULTIVITAMIN CH
1.0000 | ORAL_TABLET | Freq: Every day | ORAL | Status: DC
  Administered 2017-03-27 – 2017-03-28 (×2): 1 via ORAL
  Filled 2017-03-27 (×2): qty 1

## 2017-03-27 MED ORDER — SODIUM CHLORIDE 0.9 % IV SOLN
INTRAVENOUS | Status: AC
  Administered 2017-03-27: 1 g via INTRAVENOUS
  Filled 2017-03-27: qty 1000

## 2017-03-27 MED ORDER — BENZOCAINE-MENTHOL 20-0.5 % EX AERO: 1.0000 "application " | INHALATION_SPRAY | CUTANEOUS | Status: DC | PRN

## 2017-03-27 NOTE — H&P (Signed)
THIS IS A COPY OF DR. Francena HanlyBEASLEY'S H&P from 03/24/17, updated for date of service.  OB ADMISSION/ HISTORY & PHYSICAL:  Admission Date: 03/24/2017 12:12 PM  Admit Diagnosis: Fetal growth restriction  Holly Watson is a 21 y.o. female G2P0101 at 5136+1 presenting for induction of labor. Pregnancy complicated by fetal growth restriction and elevated umbilical artery dopplers (>97.5%), which have been stable. She was first seen at [facility] for FGR on 01/20/17 at 26+4 and has been followed since that visit.  Ultrasound today found a single live pregnancy with an EFW of 1790g, <3% (3#15oz). Recent fetal growth velocity has decreased, though the elevated dopplers are stable. Anatomy is normal. BPP 8/8.  She received BMZ x2 over the last two days.   Prenatal History: G2P0101   EDC : 04/22/2017, by Patient Reported  [facility]  Prenatal Labs: ABO, Rh:   O pos O+ Ab screen Negative G/C Neg/Neg HIV Negative Hep B/RPR Negative/Non reactive  Rubella Immune VZV Immune  GTT: 94 GBS:   drawn 03/24/17  Medical / Surgical History :  Past medical history:      Past Medical History:  Diagnosis Date  . Medical history non-contributory      Past surgical history:       Past Surgical History:  Procedure Laterality Date  . NO PAST SURGERIES      Family History: No family history on file.   Social History:  reports that she has been smoking Cigarettes.  She has been smoking about 0.25 packs per day. She has never used smokeless tobacco. She reports that she uses drugs, including Marijuana. She reports that she does not drink alcohol.   Allergies: Patient has no known allergies.    Current Medications at time of admission:         Prior to Admission medications   Medication Sig Start Date End Date Taking? Authorizing Provider  aspirin 81 MG chewable tablet Chew 81 mg by mouth daily.    [provider]  Prenatal Vit-Fe Fumarate-FA (PRENATAL  MULTIVITAMIN) TABS tablet Take 1 tablet by mouth daily at 12 noon.    [provider]     Physical Exam:  VS: 110/64  98.8  P 105  R 156  100% RA  LMP 07/16/16, BMI 24.45, Wt 138.lb, Ht 5'3"  Genitalia / VE: Dilation:3cm Effacement (%): 80 Station: -2 Exam by:: Dr.Ward   Assessment: 36+[redacted] weeks gestation, fetal growth restriction FHR category 1  Plan:    Admit for induction labor Labs pending Epidural when desired Continuous fetal monitoring   1. Fetal Well being  - Fetal Tracing: 1 - Ultrasound:  reviewed, as above - Group B Streptococcus: positive - Presentation: vtx confirmed by u/s   2. Routine OB: - Prenatal labs reviewed, as above - Rh positive  3. Induction of Labor:  -  Contractions external toco in place -  Pelvis proven to 3#15oz -  Plan for induction with cytotec, AROM  4. Post Partum Planning: - Infant feeding: breast - Contraception: IUD   ----- Ranae Plumberhelsea Ward, MD Attending Obstetrician and Gynecologist [facility], Department of OB/GYN [facility]

## 2017-03-27 NOTE — Discharge Summary (Signed)
Obstetrical Discharge Summary  Patient Name: Holly Watson DOB: 10/12/1995 MRN: [id number]  Date of Admission: 03/26/2017 Date of Delivery:03/27/17 Delivered by: Holly Plumber, MD Date of Discharge: 03/28/2017  [facility]: [facility]   VFI:EPPIRJJ'O last menstrual period was 07/16/2016 (exact date). EDC Estimated Date of Delivery: 04/22/17 Gestational Age at Delivery: [redacted]w[redacted]d   Antepartum complications:  1. Smoker 2. Fetal growth restriction (severe) with abnormal dopplers 3. History of preterm delivery 4. Marijuana use  Admitting Diagnosis:  Secondary Diagnosis: Patient Active Problem List   Diagnosis Date Noted  . Intrauterine growth restriction affecting antepartum care of mother in third trimester 03/26/2017  . Labor and delivery indication for care or intervention 03/24/2017  . Pregnancy 03/12/2017  . Cigarette smoker 01/20/2017  . Poor fetal growth affecting pregnancy in third trimester, antepartum 06/30/2015    Augmentation: AROM, Pitocin and Cytotec Complications: None Intrapartum complications/course: Mom presented to L&D for induction of labor due to severe growth restriction.  Cervical ripening with cytotec, then augmented with pitocin.  epidual placed. Progressed to complete, second stage: <33min, with delivery of fetal head without restitution.   Anterior then posterior shoulders delivered without difficulty.  Baby held below level of placenta for >60sec. She was then placed on mom's chest, and attended to by peds.  Cord was then clamped and cut by FOB.  Placenta spontaneously delivered, intact.   IV pitocin given for hemorrhage prophylaxis. Date of Delivery: 03/27/17 Delivered By: Holly Watson Delivery Type: spontaneous vaginal delivery Anesthesia: epidural Placenta: sponatneous Laceration: none Episiotomy: none Newborn Data: Live born female "Holly Watson"  Birth Weight: 4 lb 2 oz (1870 g) APGAR: 8, 8  Postpartum Procedures:none  Post partum course:  Patient  had an uncomplicated postpartum course.  By time of discharge on PPD#1, her pain was controlled on oral pain medications; she had appropriate lochia and was ambulating, voiding without difficulty and tolerating regular diet.  She was deemed stable for discharge to home.    Discharge Physical Exam: BP (!) 98/50 (BP Location: Right Arm)   Pulse (!) 53   Temp 98.5 F (36.9 C) (Oral)   Resp 17   Ht 5\' 3"  (1.6 m)   Wt 62.6 kg (138 lb)   LMP 07/16/2016 (Exact Date)   SpO2 100%   Breastfeeding? Unknown   BMI 24.45 kg/m   General: NAD CV: RRR Pulm: CTABL, nl effort ABD: s/nd/nt, fundus firm and below the umbilicus Lochia: moderate DVT Evaluation: LE non-ttp, no evidence of DVT on exam.  Hemoglobin  Date Value Ref Range Status  03/28/2017 10.0 (L) 12.0 - 16.0 g/dL Final   HGB  Date Value Ref Range Status  03/26/2012 13.2 12.0 - 16.0 g/dL Final   HCT  Date Value Ref Range Status  03/28/2017 29.1 (L) 35.0 - 47.0 % Final  03/26/2012 38.5 35.0 - 47.0 % Final     Disposition: stable, discharge to home. Baby Feeding: breastmilk  Baby Disposition: nicu  Rh Immune globulin given: n/a Rubella vaccine given: n/a Tdap vaccine given in AP or PP setting: AP Flu vaccine given in AP or PP setting: n/a  Contraception: IUD  Prenatal Labs:  O+ Ab screen Negative G/C Neg/Neg HIV Negative Hep B/RPR Negative/Non reactive  Rubella Immune VZV Immune   Plan:  Holly Watson was discharged to home in good condition. Follow-up appointment with delivering provider in 6 weeks.  Discharge Medications: Allergies as of 03/28/2017   No Known Allergies     Medication List    STOP  taking these medications   aspirin 81 MG chewable tablet     TAKE these medications   prenatal multivitamin Tabs tablet Take 1 tablet by mouth daily at 12 noon.         Signed: ----- Holly Plumberhelsea Zev Blue, MD Attending Obstetrician and Gynecologist [facility], Department of OB/GYN [facility]

## 2017-03-27 NOTE — Anesthesia Procedure Notes (Signed)
Epidural Patient location during procedure: OB Start time: 03/27/2017 4:55 AM End time: 03/27/2017 5:03 AM  Staffing Anesthesiologist: Yves DillARROLL, Previn Jian Performed: anesthesiologist   Preanesthetic Checklist Completed: patient identified, site marked, surgical consent, pre-op evaluation, timeout performed, IV checked, risks and benefits discussed and monitors and equipment checked  Epidural Patient position: sitting Prep: Betadine Patient monitoring: heart rate, continuous pulse ox and blood pressure Approach: midline Location: L3-L4 Injection technique: LOR air  Needle:  Needle type: Tuohy  Needle gauge: 17 G Needle length: 9 cm and 9 Catheter type: closed end flexible Catheter size: 19 Gauge Test dose: negative and 1.5% lidocaine with Epi 1:200 K  Assessment Sensory level: T8 Events: blood not aspirated, injection not painful, no injection resistance, negative IV test and no paresthesia  Additional Notes Time out called.  Patient placed in sitting position.  Back prepped and draped in sterile fashion.  A skin wheal was made in the L3-L4 interspace with 1% Lidocaine plain . An 18G Tuohy needle was used to gain access to the epidural space and the epidural catheter was threaded with the return of heme.  A 17G Tuohy was used and the epidural space was accessed by a loss of resistance technique as before, and the epidural catheter was threaded 3cm with no heme and negative TD.  Both attempts easy x1.  Patient tolerated the procedure well.  The catheter was affixed to the back in sterile fashion.  No paresthesias.Reason for block:procedure for pain

## 2017-03-27 NOTE — Anesthesia Preprocedure Evaluation (Signed)
Anesthesia Evaluation  Patient identified by MRN, date of birth, ID band Patient awake    Reviewed: Allergy & Precautions, NPO status , Patient's Chart, lab work & pertinent test results  Airway Mallampati: II  TM Distance: >3 FB     Dental no notable dental hx.    Pulmonary Current Smoker,    Pulmonary exam normal        Cardiovascular negative cardio ROS Normal cardiovascular exam     Neuro/Psych negative neurological ROS  negative psych ROS   GI/Hepatic Neg liver ROS,   Endo/Other  negative endocrine ROS  Renal/GU negative Renal ROS  negative genitourinary   Musculoskeletal negative musculoskeletal ROS (+)   Abdominal Normal abdominal exam  (+)   Peds negative pediatric ROS (+)  Hematology negative hematology ROS (+)   Anesthesia Other Findings   Reproductive/Obstetrics (+) Pregnancy                             Anesthesia Physical Anesthesia Plan  ASA: II  Anesthesia Plan: Epidural   Post-op Pain Management:    Induction:   PONV Risk Score and Plan:   Airway Management Planned: Natural Airway  Additional Equipment:   Intra-op Plan:   Post-operative Plan:   Informed Consent: I have reviewed the patients History and Physical, chart, labs and discussed the procedure including the risks, benefits and alternatives for the proposed anesthesia with the patient or authorized representative who has indicated his/her understanding and acceptance.   Dental advisory given  Plan Discussed with: CRNA and Surgeon  Anesthesia Plan Comments:         Anesthesia Quick Evaluation

## 2017-03-28 LAB — CBC
HCT: 26.7 % — ABNORMAL LOW (ref 35.0–47.0)
HEMATOCRIT: 29.1 % — AB (ref 35.0–47.0)
Hemoglobin: 9 g/dL — ABNORMAL LOW (ref 12.0–16.0)
Hemoglobin: 10 g/dL — ABNORMAL LOW (ref 12.0–16.0)
MCH: 28.3 pg (ref 26.0–34.0)
MCH: 28.8 pg (ref 26.0–34.0)
MCHC: 33.7 g/dL (ref 32.0–36.0)
MCHC: 34.5 g/dL (ref 32.0–36.0)
MCV: 84.1 fL (ref 80.0–100.0)
MCV: 83.5 fL (ref 80.0–100.0)
PLATELETS: 254 10*3/uL (ref 150–440)
Platelets: 280 10*3/uL (ref 150–440)
RBC: 3.18 MIL/uL — AB (ref 3.80–5.20)
RBC: 3.49 MIL/uL — ABNORMAL LOW (ref 3.80–5.20)
RDW: 14.3 % (ref 11.5–14.5)
RDW: 14.1 % (ref 11.5–14.5)
WBC: 19 10*3/uL — ABNORMAL HIGH (ref 3.6–11.0)
WBC: 19 10*3/uL — AB (ref 3.6–11.0)

## 2017-03-28 LAB — RPR: RPR Ser Ql: NONREACTIVE

## 2017-03-28 NOTE — Anesthesia Postprocedure Evaluation (Signed)
Anesthesia Post Note  Patient: Holly Watson  Procedure(s) Performed: * No procedures listed *  Patient location during evaluation: Mother Baby Anesthesia Type: Epidural Level of consciousness: awake and alert Pain management: pain level controlled Vital Signs Assessment: post-procedure vital signs reviewed and stable Respiratory status: spontaneous breathing, nonlabored ventilation and respiratory function stable Cardiovascular status: stable Postop Assessment: no headache, no backache, epidural receding, no signs of nausea or vomiting and adequate PO intake Anesthetic complications: no     Last Vitals:  Vitals:   03/28/17 0005 03/28/17 0431  BP: (!) 97/44 (!) 91/43  Pulse: (!) 54 (!) 58  Resp: 20 20  Temp: 36.9 C 36.9 C  SpO2: 96% 100%    Last Pain:  Vitals:   03/28/17 0431  TempSrc: Oral  PainSc:                  Rica MastBachich,  Gesenia Bantz M

## 2017-03-28 NOTE — Progress Notes (Signed)
All dr's discharge instructions reviewed with patient. Patient v/u of same; copy given.

## 2017-03-28 NOTE — Progress Notes (Addendum)
Post Partum Day 1 Subjective: Doing well, no complaints.  Tolerating regular diet, pain with PO meds, voiding and ambulating without difficulty. Visited mom in NICU.  No CP SOB F/C N/V or leg pain   Objective: BP (!) 98/50 (BP Location: Right Arm)   Pulse (!) 53   Temp 98.1 F (36.7 C) (Oral)   Resp 17   Ht 5\' 3"  (1.6 m)   Wt 62.6 kg (138 lb)   LMP 07/16/2016 (Exact Date)   SpO2 100%   Breastfeeding? Unknown   BMI 24.45 kg/m    Physical Exam:  General: NAD CV: RRR Pulm: nl effort, CTABL Lochia: moderate Uterine Fundus: fundus firm and below umbilicus DVT Evaluation: no cords, ttp LEs    Recent Labs  03/26/17 2245 03/28/17 0521  HGB 10.0* 9.0*  HCT 28.8* 26.7*  WBC 16.8* 19.0*  PLT 265 254    Assessment/Plan: 21 y.o. [id number] postpartum day # 1 s/p SVD with IUGR  1. Routine postpartum care 2. Baby to stay in NICU  3. Lactation service - already pumped 2oz.  Continue support. 4. Leukocytosis - repeat CBC    ----- Ranae Plumberhelsea Ward, MD Attending Obstetrician and Gynecologist [facility]

## 2017-03-28 NOTE — Clinical Social Work Note (Signed)
The following is the CSW documentation placed on patient's newborn's medical record this afternoon:  CLINICAL SOCIAL WORK MATERNAL/CHILD NOTE  Patient Details  Name: Holly Watson MRN: [id number] Date of Birth: 03/27/2016  Date:  03/28/2017  Clinical Social Worker Initiating Note:  York SpanielMonica Melville Engen MSW,LCSW         Date/ Time Initiated:  03/28/17/                 Child's Name:      Legal Guardian:  Mother   Need for Interpreter:  None   Date of Referral:        Reason for Referral:  Current Substance Use/Substance Use During Pregnancy    Referral Source:  NICU   Address:     Phone number:      Household Members: Minor Children, Parents   Natural Supports (not living in the home): Spouse/significant other   Professional Supports:None   Employment:Unemployed   Type of Work:     Education:      Surveyor, quantityinancial Resources:Self-Pay    Other [facility]   Cultural/Religious Considerations Which May Impact Care: none  Strengths: Ability to meet basic needs , Home prepared for child    Risk Factors/Current Problems: Substance Use    Cognitive State: Alert , Able to Concentrate    Mood/Affect: Calm , Bright , Comfortable    CSW Assessment:CSW consulted due to new NICU admit and maternal drug use during pregnancy. Spoke with patient's mother and father in mother's room. CSW explained role and purpose of visit. Patient's mother resides with her mother and her little over a year child. Significant other does not live in the home but is financial and emotionally supportive. Patient's mother does not work and relies on her mother and her significant other for financial support. Patient's mother states she has all supplies for her newborn and has no concerns regarding transportation. Patient's mother reports having a mild case of postpartum depression with her first child and is aware of the signs and symptoms and states she will seek help  again if needed. Patient's mother reports marijuana use for mainly recreational use and to relax her. She admits that she will continue to use after discharge but states she will not use while caring for her children or around her children. Patient's UDS returned negative but CSW has asked if cord tissue can be sent for testing.   CSW Plan/Description: Restaurant manager, fast foodatient/Family Education , Psychosocial Support and Ongoing Assessment of Needs    York SpanielMonica Noelle Sease, [place]CS[place] 03/28/2017, 12:36 PM

## 2017-03-28 NOTE — Discharge Instructions (Signed)
Discharge instructions:   Call office if you have any of the following: headache, visual changes, fever >101.0 F, chills, breast concerns, excessive vaginal bleeding, incision drainage or problems, leg pain or redness, depression or any other concerns.   Activity: Do not lift > 10 lbs for 6 weeks.  No intercourse or tampons for 6 weeks.  No driving for 1-2 weeks.   Call your doctor for increased pain or vaginal bleeding, temperature above 101.0, depression, or concerns.  No strenuous activity or heavy lifting for 6 weeks.  No intercourse, tampons, douching, or enemas for 6 weeks.  No tub baths-showers only.  No driving for 2 weeks or while taking pain medications.  Continue prenatal vitamin and iron.  Increase calories and fluids while breastfeeding.  You may have a slight fever when your milk comes in, but it should go away on its own.  If it does not, and rises above 101.0 please call the doctor.  For concerns about your baby, please call your pediatrician For breastfeeding concerns, the lactation consultant can be reached at [phone]      Home Care Instructions for Mom ACTIVITY  Gradually return to your regular activities.  Let yourself rest. Nap while your baby sleeps.  Avoid lifting anything that is heavier than 10 lb (4.5 kg) until your health care provider says it is okay.  Avoid activities that take a lot of effort and energy (are strenuous) until approved by your health care provider. Walking at a slow-to-moderate pace is usually safe.  If you had a cesarean delivery: ? Do not vacuum, climb stairs, or drive a car for 4-6 weeks. ? Have someone help you at home until you feel like you can do your usual activities yourself. ? Do exercises as told by your health care provider, if this applies.  VAGINAL BLEEDING You may continue to bleed for 4-6 weeks after delivery. Over time, the amount of blood usually decreases and the color of the blood usually gets lighter. However,  the flow of bright red blood may increase if you have been too active. If you need to use more than one pad in an hour because your pad gets soaked, or if you pass a large clot:  Lie down.  Raise your feet.  Place a cold compress on your lower abdomen.  Rest.  Call your health care provider.  If you are breastfeeding, your period should return anytime between 8 weeks after delivery and the time that you stop breastfeeding. If you are not breastfeeding, your period should return 6-8 weeks after delivery. PERINEAL CARE The perineal area, or perineum, is the part of your body between your thighs. After delivery, this area needs special care. Follow these instructions as told by your health care provider.  Take warm tub baths for 15-20 minutes.  Use medicated pads and pain-relieving sprays and creams as told.  Do not use tampons or douches until vaginal bleeding has stopped.  Each time you go to the bathroom: ? Use a peri bottle. ? Change your pad. ? Use towelettes in place of toilet paper until your stitches have healed.  Do Kegel exercises every day. Kegel exercises help to maintain the muscles that support the vagina, bladder, and bowels. You can do these exercises while you are standing, sitting, or lying down. To do Kegel exercises: ? Tighten the muscles of your abdomen and the muscles that surround your birth canal. ? Hold for a few seconds. ? Relax. ? Repeat until you have done  this 5 times in a row.  To prevent hemorrhoids from developing or getting worse: ? Drink enough fluid to keep your urine clear or pale yellow. ? Avoid straining when having a bowel movement. ? Take over-the-counter medicines and stool softeners as told by your health care provider.  BREAST CARE  Wear a tight-fitting bra.  Avoid taking over-the-counter pain medicine for breast discomfort.  Apply ice to the breasts to help with discomfort as needed: ? Put ice in a plastic bag. ? Place a towel  between your skin and the bag. ? Leave the ice on for 20 minutes or as told by your health care provider.  NUTRITION  Eat a well-balanced diet.  Do not try to lose weight quickly by cutting back on calories.  Take your prenatal vitamins until your postpartum checkup or until your health care provider tells you to stop.  POSTPARTUM DEPRESSION You may find yourself crying for no apparent reason and unable to cope with all of the changes that come with having a newborn. This mood is called postpartum depression. Postpartum depression happens because your hormone levels change after delivery. If you have postpartum depression, get support from your partner, friends, and family. If the depression does not go away on its own after several weeks, contact your health care provider. BREAST SELF-EXAM Do a breast self-exam each month, at the same time of the month. If you are breastfeeding, check your breasts just after a feeding, when your breasts are less full. If you are breastfeeding and your period has started, check your breasts on day 5, 6, or 7 of your period. Report any lumps, bumps, or discharge to your health care provider. Know that breasts are normally lumpy if you are breastfeeding. This is temporary, and it is not a health risk. INTIMACY AND SEXUALITY Avoid sexual activity for at least 3-4 weeks after delivery or until the brownish-red vaginal flow is completely gone. If you want to avoid pregnancy, use some form of birth control. You can get pregnant after delivery, even if you have not had your period. SEEK MEDICAL CARE IF:  You feel unable to cope with the changes that a child brings to your life, and these feelings do not go away after several weeks.  You notice a lump, a bump, or discharge on your breast.  SEEK IMMEDIATE MEDICAL CARE IF:  Blood soaks your pad in 1 hour or less.  You have: ? Severe pain or cramping in your lower abdomen. ? A bad-smelling vaginal discharge. ? A  fever that is not controlled by medicine. ? A fever, and an area of your breast is red and sore. ? Pain or redness in your calf. ? Sudden, severe chest pain. ? Shortness of breath. ? Painful or bloody urination. ? Problems with your vision.  You vomit for 12 hours or longer.  You develop a severe headache.  You have serious thoughts about hurting yourself, your child, or anyone else.  This information is not intended to replace advice given to you by your health care provider. Make sure you discuss any questions you have with your health care provider. Document Released: 07/09/2000 Document Revised: 12/18/2015 Document Reviewed: 01/13/2015 Elsevier Interactive Patient Education  2017 [employer]

## 2017-03-28 NOTE — Progress Notes (Signed)
Patient discharged to home. Patient left 3rd floor ambulatory accompanied by Janean SarkMary Lee NT and 2 friends. (Infant remains in SCN).

## 2017-03-30 LAB — SURGICAL PATHOLOGY

## 2017-03-31 ENCOUNTER — Other Ambulatory Visit: Payer: Medicaid Other

## 2019-03-26 IMAGING — US US MFM FETAL BPP W/O NON-STRESS
1 series · 14 of 18 positions shown · non-contrast
Comparison: none

[Series 1: us mfm fetal bpp w/o non-stress · 0.25mm/px · 18 acquisitions, 14 frames shown]
[im 1/18]
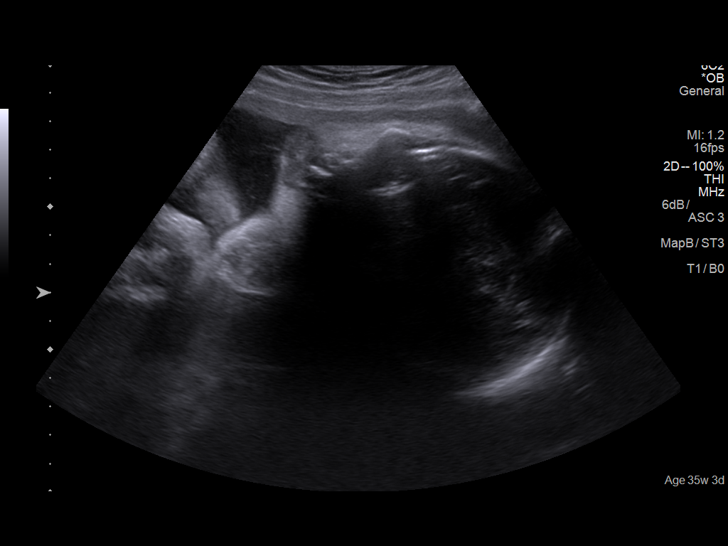
[im 2/18]
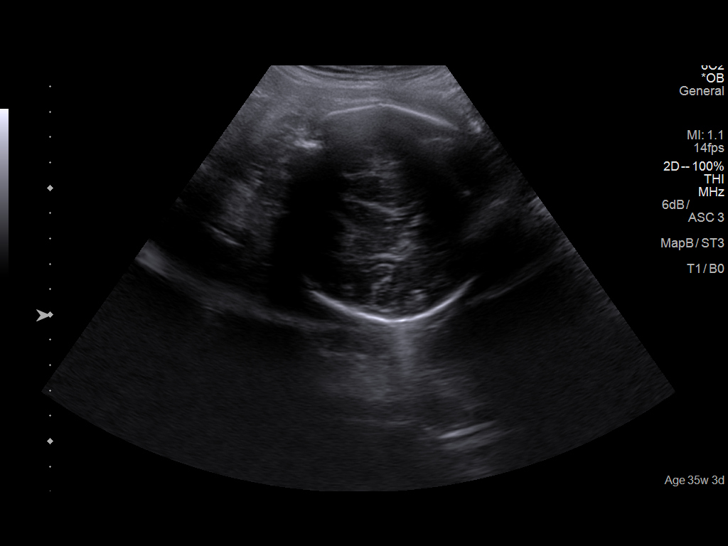
[im 4/18]
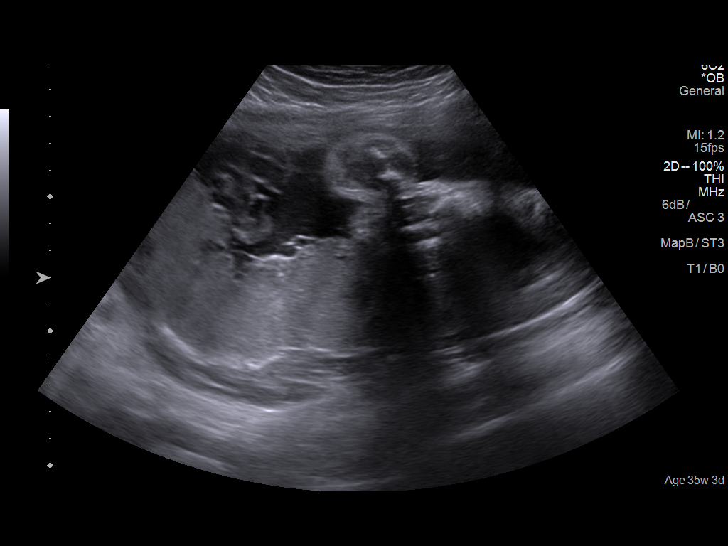
[im 5/18]
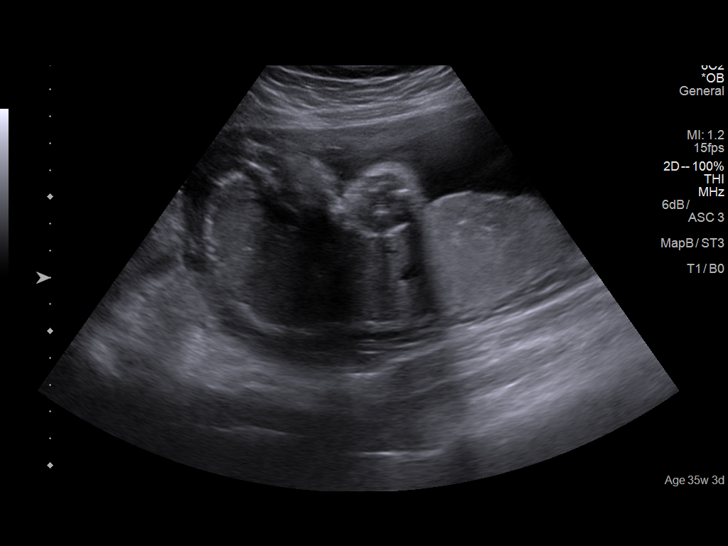
[im 6/18]
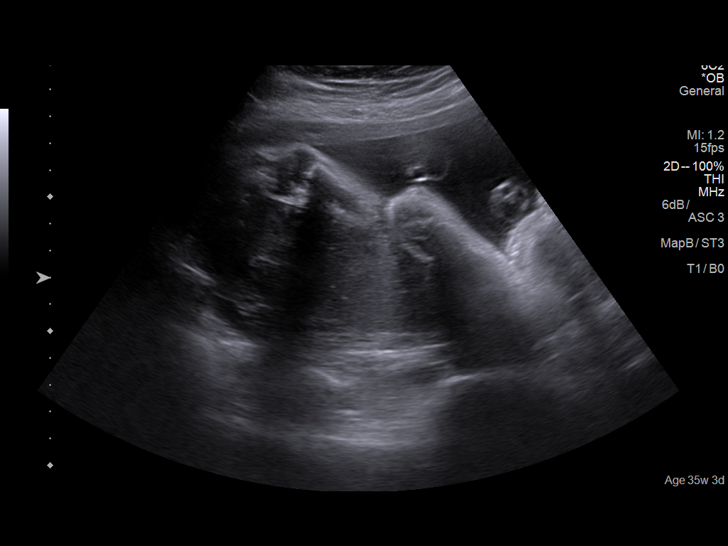
[im 8/18]
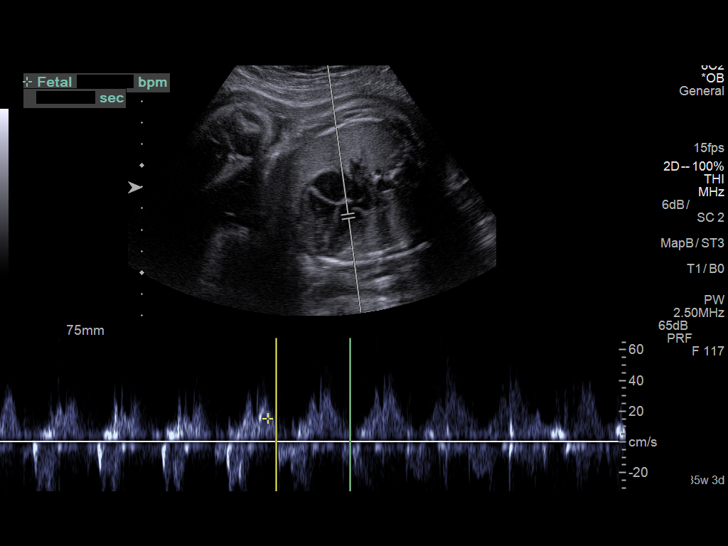
[im 9/18]
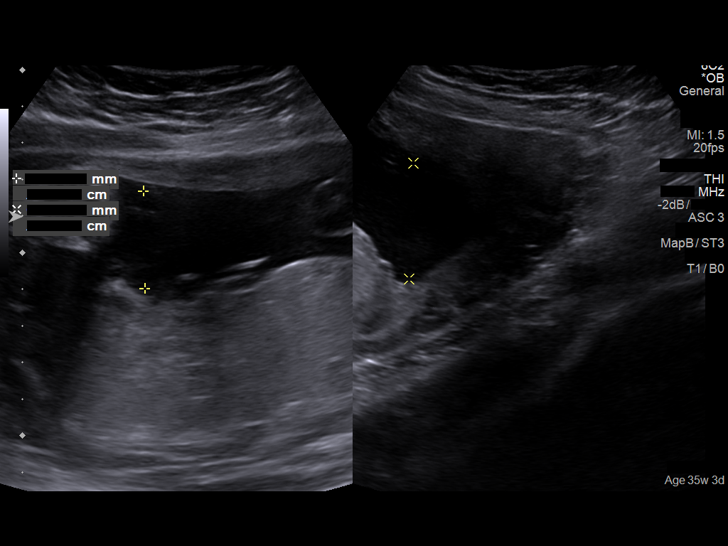
[im 10/18]
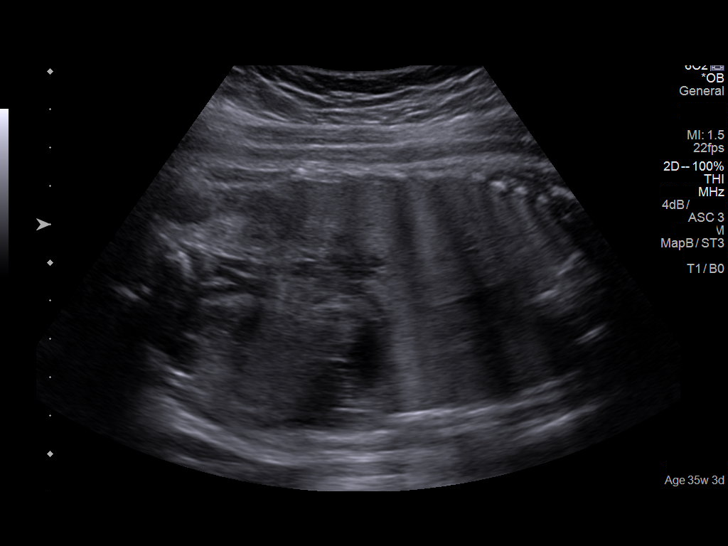
[im 11/18]
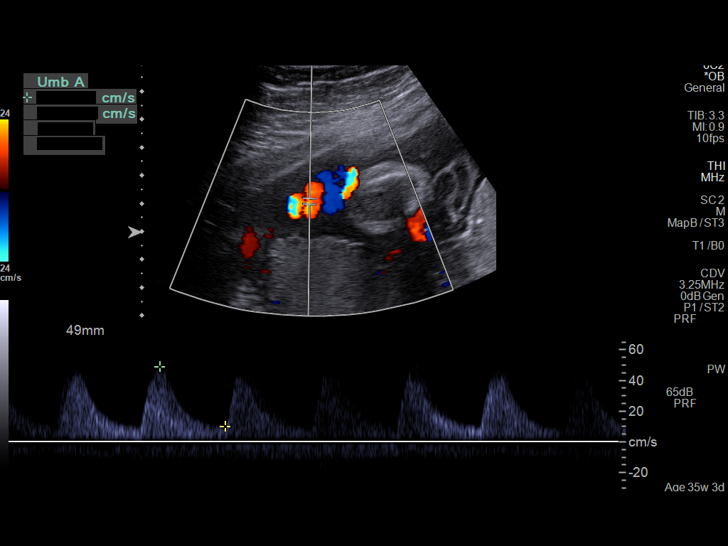
[im 13/18]
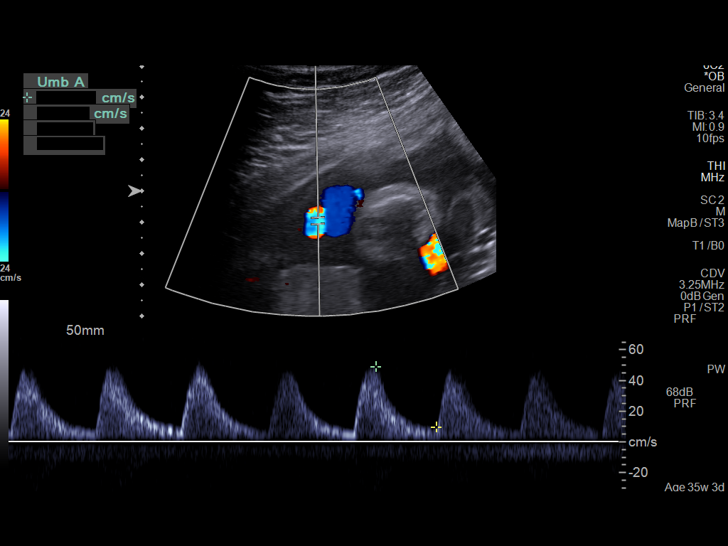
[im 14/18]
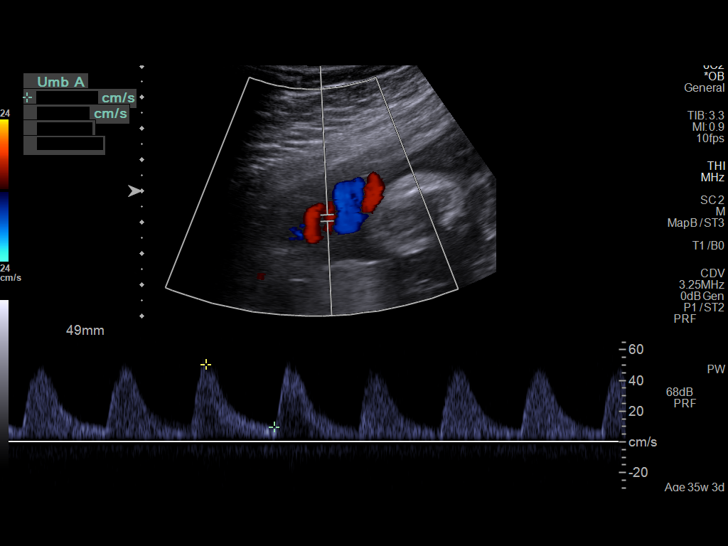
[im 15/18]
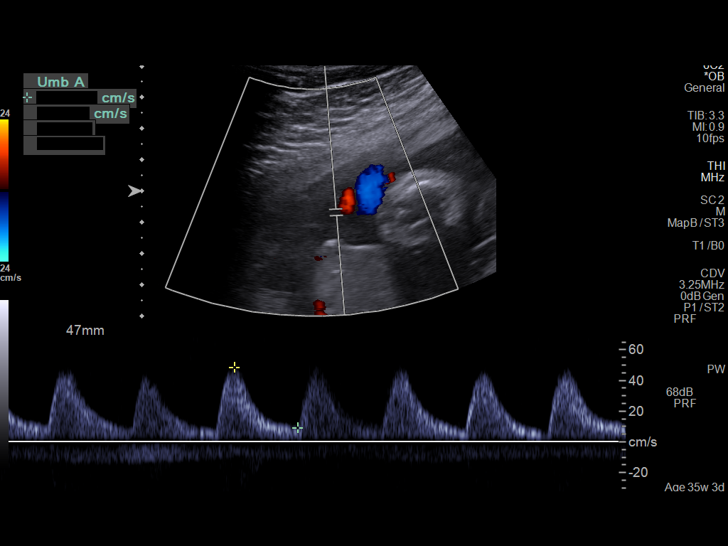
[im 17/18]
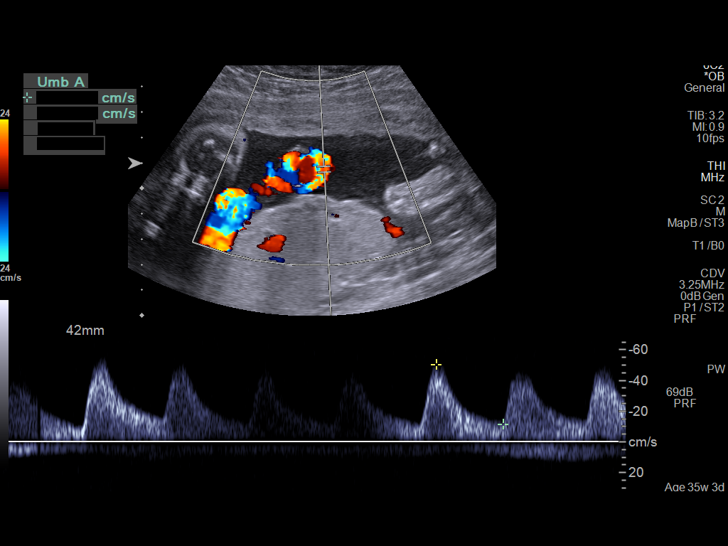
[im 18/18]
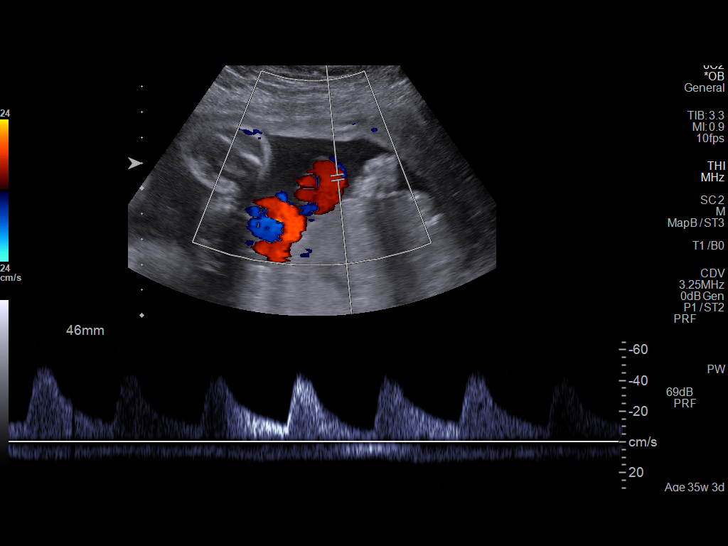

[14 of 18 positions shown; findings below may reference images not displayed]

Canned report from images found in remote index.

Refer to host system for actual result text.

## 2019-03-29 IMAGING — US US MFM OB LIMITED
1 series · 12 of 28 positions shown · non-contrast
Comparison: none

PATIENT INFO:

PERFORMED BY:
SERVICE(S) PROVIDED:
US MFM OB LIMITED                                    76815.01
INDICATIONS:
35 weeks gestation of pregnancy
IUGR
FETAL EVALUATION:
Num Of Fetuses:     1
Fetal Heart         133
Rate(bpm):
Cardiac Activity:   Present
Presentation:       Vertex
Placenta:           Posterior
AFI Sum(cm)     %Tile       Largest Pocket(cm)
10.78           27
RUQ(cm)                     LUQ(cm)        LLQ(cm)
5.18
BIOPHYSICAL EVALUATION:
Amniotic F.V:   Pocket => 2 cm two         F. Tone:        Observed
planes
F. Movement:    Observed                   Score:          [DATE]
F. Breathing:   Observed
BIOMETRY:
BPD:      82.2  mm     G. Age:  33w 1d          4  %    CI:        71.88   %    70 - 86
FL/HC:      20.2   %    20.1 -
HC:      308.6  mm     G. Age:  34w 3d          5  %    HC/AC:      1.18        0.93 -
AC:      261.5  mm     G. Age:  30w 2d        < 3  %    FL/BPD:     75.9   %    71 - 87
FL:       62.4  mm     G. Age:  32w 2d        < 3  %    FL/AC:      23.9   %    20 - 24
HUM:      54.7  mm     G. Age:  31w 6d        < 5  %
CM:          6  mm
Est. FW:    4648  gm    3 lb 15 oz     < 3  %
GESTATIONAL AGE:
LMP:           35w 6d        Date:  07/16/16                 EDD:   04/22/17
U/S Today:     32w 4d                                        EDD:   05/15/17
Best:          35w 4d     Det. By:  Early Ultrasound         EDD:   04/24/17
(10/13/16)
ANATOMY:
Cranium:               Within Normal Limits   LVOT:                   Normal appearance
Cavum:                 Visualized             Stomach:                Seen
previously
Ventricles:            Normal appearance      Abdominal Wall:         Visualized
Choroid Plexus:        Within Normal Limits   Cord Vessels:           3 vessels,
visualized previously
Cerebellum:            Visualized             Kidneys:                Normal appearance
Posterior Fossa:       Visualized             Bladder:                Seen
Face:                  Orbits visualized      Spine:                  Visualized
previously                                     previously
Lips:                  Visualized             Upper Extremities:      Visualized
Heart:                 4-Chamber view         Lower Extremities:      Visualized
appears normal                                 previously
RVOT:                  Normal appearance
DOPPLER - FETAL VESSELS:
Umbilical Artery
S/D     %tile     RI    %tile                     PSV
(cm/s)
4.25    > 97.5  0.79    > 97.5                       48

[Series 1: us mfm ob limited · 32 acquisitions, 12 frames shown]
[im 2/32]
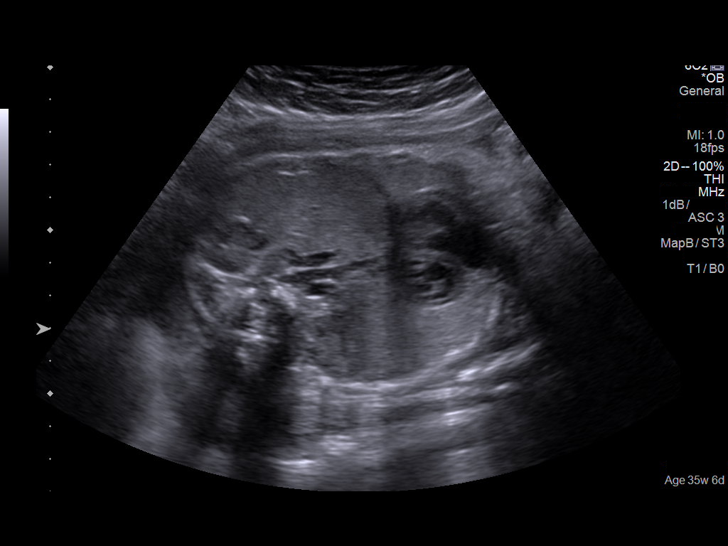
[im 4/32]
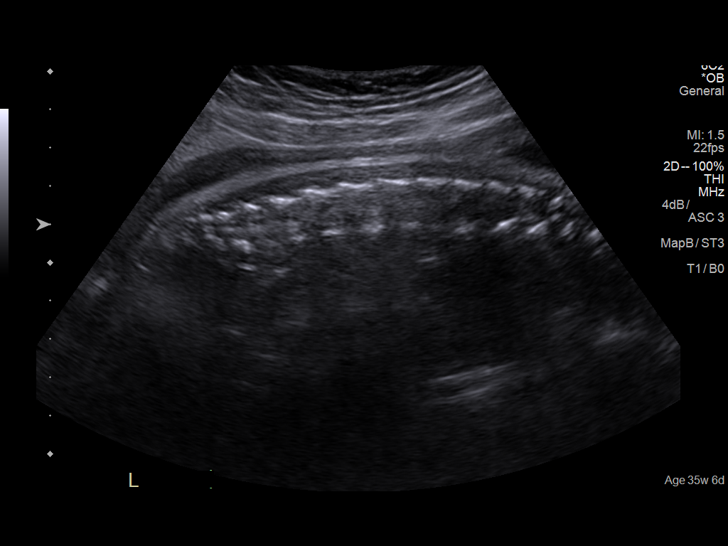
[im 6/32]
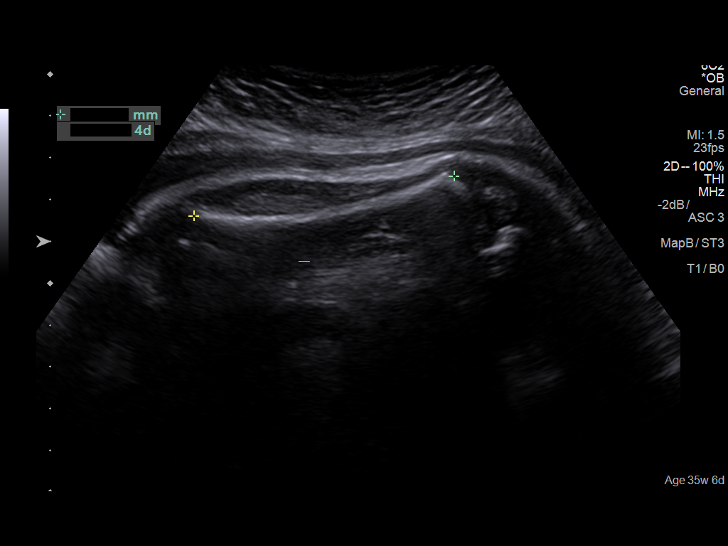
[im 10/32]
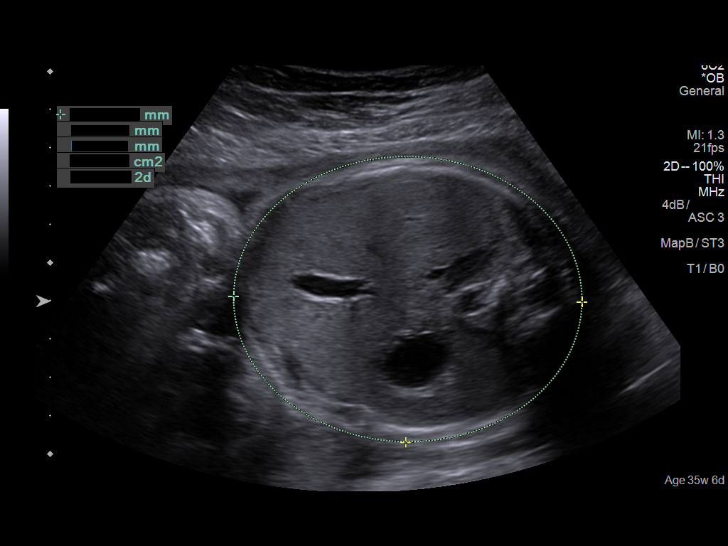
[im 12/32]
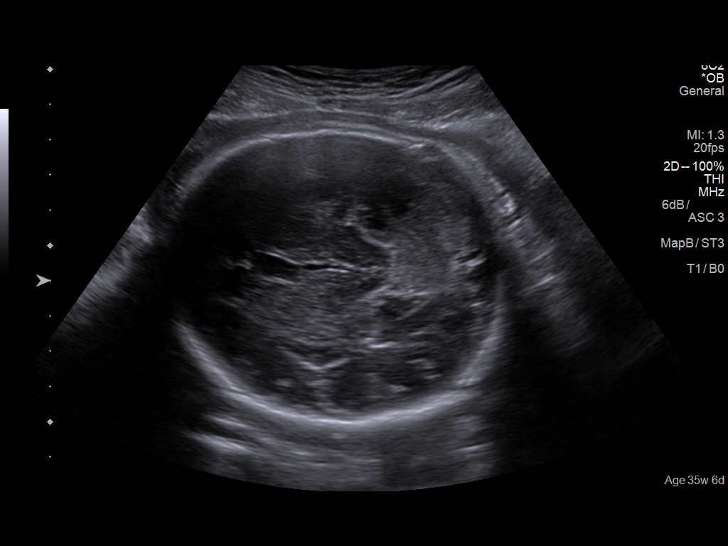
[im 14/32]
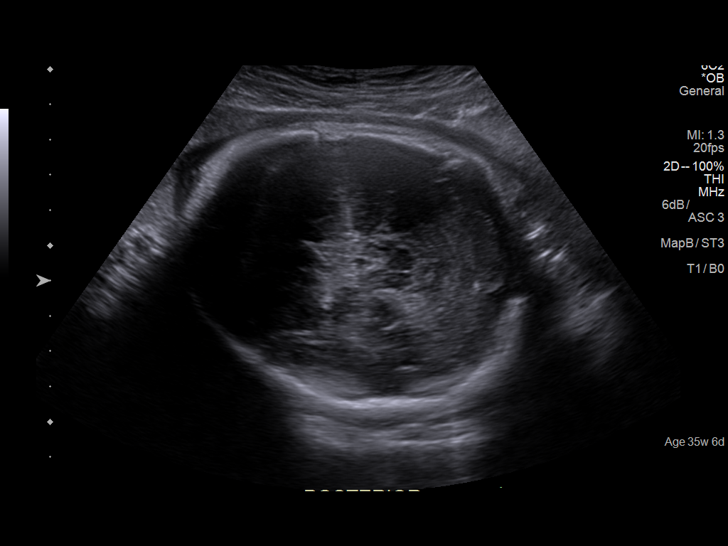
[im 18/32]
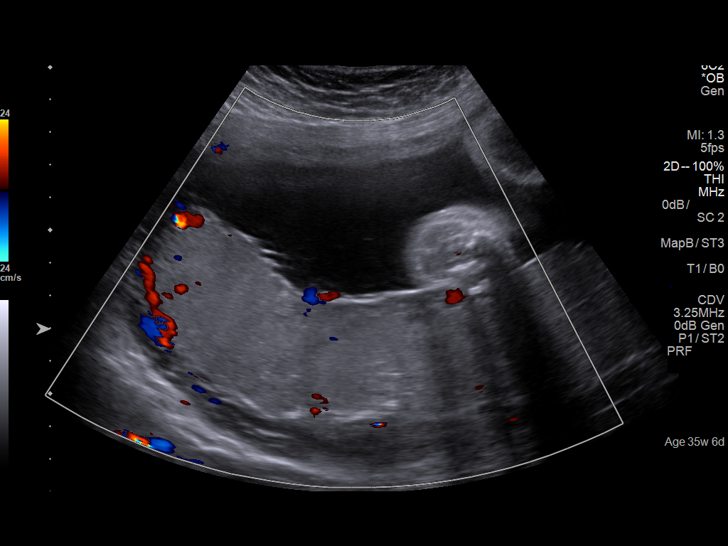
[im 20/32]
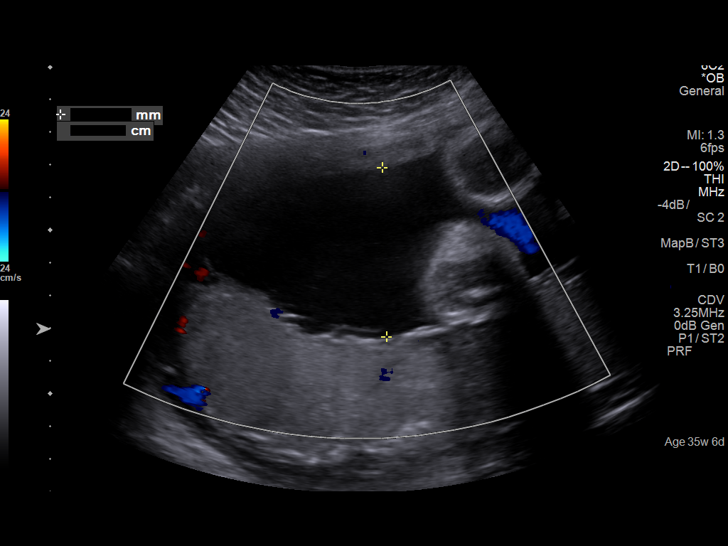
[im 22/32]
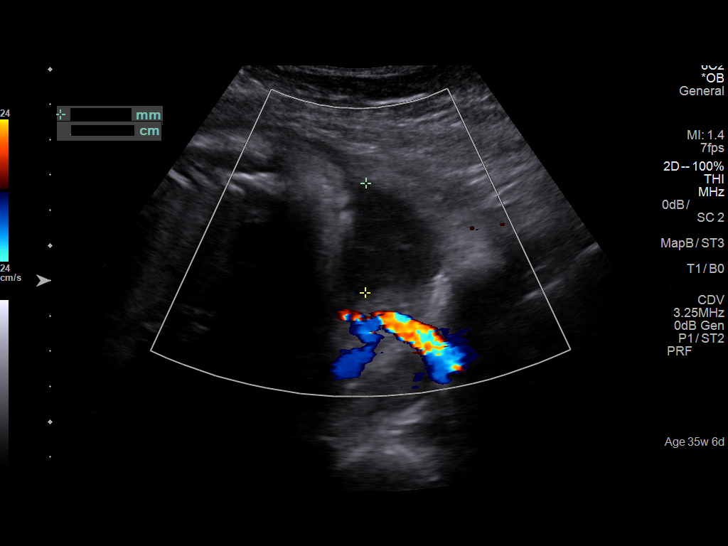
[im 26/32]
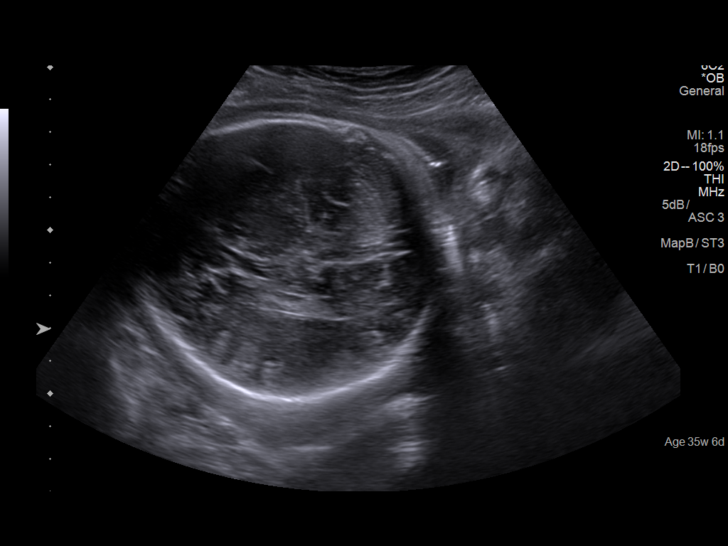
[im 28/32]
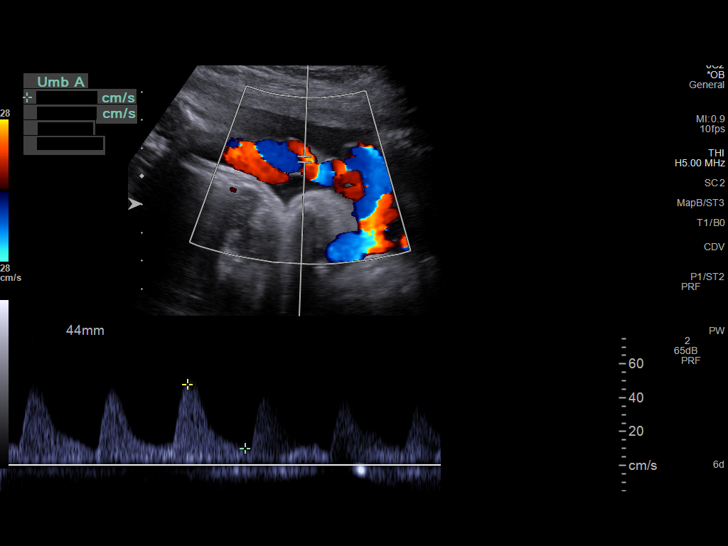
[im 30/32]
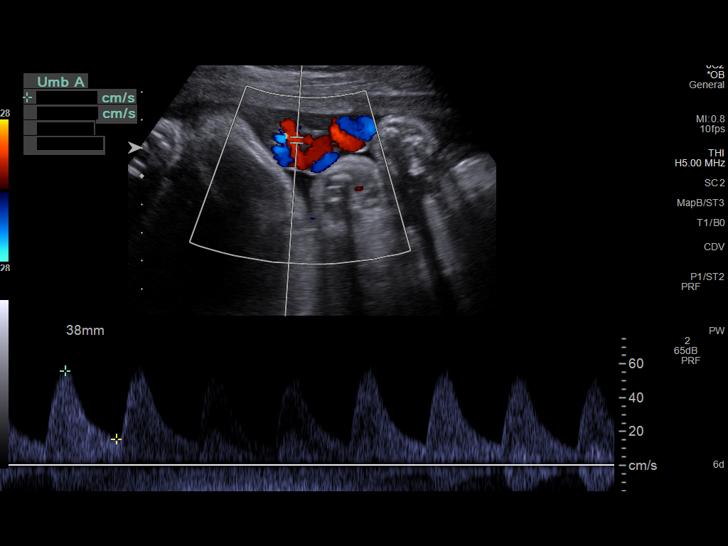

[12 of 28 positions shown; findings below may reference images not displayed]

IMPRESSION: Ms. Ave Maria returns for fetal testing and growth assessment
in setting of pregnancy complicated by fetal growth restriction
(FGR) and elevated umbilical artery dopplers.  She was first
seen at [facility] for FGR on 01/20/2017 at   91w6d.
Today, she reports good fetal movement.  She stopped
smoking after the diagnosis of FGR.

Ultrasound demonstrates a single live pregnancy at 35 [DATE]
weeks. Dating is by earliest available ultrasound perfomed at
[REDACTED] on 10/13/16 with measurements of 12 [DATE]
weeks.

The estimated fetal weight is 4648 g  (<3%). There has been
an estimated 285 g of growth in three weeks (< anticipated
300 g).  There has been an increase in the abdominal
circumference of only 0.5 cm (< anticipated 3 cm).

Fetal anatomy was visualized today or visualized previously
and appears normal.

The amniotic fluid volume is normal and the BPP is [DATE].  The
mean umbilical artery S/D is 4.25, which remains elevated
(>97.5%).  There is diastolic flow seen, however, in all
tracings.
RECOMMENDATIONS:

The findings were discussed.

Given the the severe fetal growth restriction, the plateuing of
fetal growth and the abnormal umbilical artery Dopplers, I
would recommend delivery no later than 37 weeks gestation
(10 days from now).  Alternatively, she could receive steroids
per the ALPS protocol and be delivered sooner.

I discussed these alternatives with the patient and her
partner.  She was comfortable with either plan.

I spoke with Dr. Tacanho, and she favors delivery sooner.
The patient will report to Labor and Delivery now for steroids.

## 2019-07-12 ENCOUNTER — Other Ambulatory Visit: Payer: Self-pay

## 2019-07-12 ENCOUNTER — Encounter: Payer: Self-pay | Admitting: Emergency Medicine

## 2019-07-12 ENCOUNTER — Ambulatory Visit
Admission: EM | Admit: 2019-07-12 | Discharge: 2019-07-12 | Disposition: A | Discharge status: Home / Self Care | Payer: Medicaid Other | Attending: Family Medicine | Admitting: Family Medicine

## 2019-07-12 ENCOUNTER — Ambulatory Visit: Payer: Medicaid Other

## 2019-07-12 DIAGNOSIS — F17200 Nicotine dependence, unspecified, uncomplicated: Secondary | ICD-10-CM | POA: Diagnosis present

## 2019-07-12 DIAGNOSIS — R05 Cough: Secondary | ICD-10-CM | POA: Diagnosis present

## 2019-07-12 DIAGNOSIS — Z20822 Contact with and (suspected) exposure to covid-19: Secondary | ICD-10-CM

## 2019-07-12 DIAGNOSIS — Z20828 Contact with and (suspected) exposure to other viral communicable diseases: Secondary | ICD-10-CM | POA: Insufficient documentation

## 2019-07-12 DIAGNOSIS — R062 Wheezing: Secondary | ICD-10-CM

## 2019-07-12 DIAGNOSIS — R0602 Shortness of breath: Secondary | ICD-10-CM

## 2019-07-12 DIAGNOSIS — R053 Chronic cough: Secondary | ICD-10-CM

## 2019-07-12 MED ORDER — ALBUTEROL SULFATE HFA 108 (90 BASE) MCG/ACT IN AERS: 2.0000 | INHALATION_SPRAY | RESPIRATORY_TRACT | 0 refills | Status: DC | PRN

## 2019-07-12 MED ORDER — BENZONATATE 200 MG PO CAPS: 200.0000 mg | ORAL_CAPSULE | Freq: Three times a day (TID) | ORAL | 0 refills | Status: DC | PRN

## 2019-07-12 MED ORDER — PREDNISONE 10 MG (21) PO TBPK: ORAL_TABLET | Freq: Every day | ORAL | 0 refills | Status: DC

## 2019-07-12 NOTE — ED Triage Notes (Signed)
Pt c/o chronic cough. Started about a year ago. She states that it has gotten worse within the last month. She had virtual visits with her PCP and given steroids and was referred to a pulmonologist. But has not been seen by them. She also has shortness of breath when she coughs a lot. Denies fever. Pt is a smoker.

## 2019-07-12 NOTE — Discharge Instructions (Signed)
It was very nice seeing you today in clinic. Thank you for entrusting me with your care.   Please utilize the medications that we discussed. Your prescriptions has been called in to your pharmacy.   You were tested for SARS-CoV-2 (novel coronavirus) today. Testing is performed by an outside lab ([facility]) and has variable turn around times ranging between 2-5 days. Current recommendations from the the CDC and [place] DHHS require that you remain out of work in order to quarantine at home until negative test results are have been received. In the event that your test results are positive, you will be contacted with further directives. These measures are being implemented out of an abundance of caution to prevent transmission and spread during the current SARS-CoV-2 pandemic.  Make arrangements to follow up with your pulmonologist.  I have provided you the name and office contact information for an excellent local provider. If your symptoms/condition worsens, please seek follow up care either here or in the ER. Please remember, our [facility] providers are "right here with you" when you need [place].   Again, it was my pleasure to take care of you today. Thank you for choosing our clinic. I hope that you start to feel better quickly.   Honor Loh, MSN, APRN, FNP-C, CEN Advanced Practice Provider [facility]

## 2019-07-13 LAB — NOVEL CORONAVIRUS, NAA (HOSP ORDER, SEND-OUT TO REF LAB; TAT 18-24 HRS): SARS-CoV-2, NAA: NOT DETECTED

## 2019-07-13 NOTE — ED Provider Notes (Signed)
Holly Watson, Holly Watson   Name: Holly Watson DOB: [birth year] MRN: [id number] CSN: [id number] PCP: [facility] date and time:  07/12/19 1644  Chief Complaint:  Cough   NOTE: Prior to seeing the patient today, I have reviewed the triage nursing documentation and vital signs. Clinical staff has updated patient's PMH/PSHx, current medication list, and drug allergies/intolerances to ensure comprehensive history available to assist in medical decision making.   History:   HPI: Holly Watson is a 23 y.o. female who presents today with complaints of a chronic cough that she has had for approximately 1 year. Patient advising that her symptoms started off as a sinus infection, however a year later, she continue to have the associated cough. Over the course of the last 2 months, patient feels as if her chest has been more tight. She has been seen by a provider at [facility] and treated with prednisone and benzonatate, which patient notes improved her symptoms to some degree. Despite symptoms, patient has never had imaging of her chest. Due to the chronicity of her symptoms, patient has recently been referred to pulmonology at [facility]. She advises that she called to schedule the appointment, however was advised that they do not accept Medicaid patients and that she would need to be seen by an urgent care for any perceived exacerbations in her symptoms. Over the last few days, patient reporting that her cough seems to have worsened. She becomes short of breath and wheezes at times. Patient denies being in close contact with anyone known to be ill. She has never been tested for SARS-CoV-2 (novel coronavirus) per her report.  Patient is a 1 ppd smoker.   Past Medical History:  Diagnosis Date  . Medical history non-contributory     Past Surgical History:  Procedure Laterality Date  . NO PAST SURGERIES      Family History  Problem Relation Age of Onset  . Healthy  Mother   . Healthy Father     Social History   Tobacco Use  . Smoking status: Current Every Day Smoker    Packs/day: 1.00    Types: Cigarettes  . Smokeless tobacco: Never Used  . Tobacco comment: "states smoked last pack and not bought any"  Substance Use Topics  . Alcohol use: No  . Drug use: Not Currently    Types: Marijuana    Comment: stopped about 1 month ago    Patient Active Problem List   Diagnosis Date Noted  . Intrauterine growth restriction affecting antepartum care of mother in third trimester 03/26/2017  . Labor and delivery indication for care or intervention 03/24/2017  . Pregnancy 03/12/2017  . Cigarette smoker 01/20/2017  . Poor fetal growth affecting pregnancy in third trimester, antepartum 06/30/2015    Home Medications:    No outpatient medications have been marked as taking for the 07/12/19 encounter [facility] Encounter).    Allergies:   Patient has no known allergies.  Review of Systems (ROS): Review of Systems  Constitutional: Negative for fatigue and fever.  HENT: Negative for congestion, ear pain, postnasal drip, rhinorrhea, sinus pressure, sinus pain, sneezing and sore throat.   Eyes: Negative for pain, discharge and redness.  Respiratory: Positive for cough, chest tightness, shortness of breath and wheezing. Negative for stridor.   Cardiovascular: Negative for chest pain and palpitations.  Gastrointestinal: Negative for abdominal pain, diarrhea, nausea and vomiting.  Musculoskeletal: Negative for arthralgias, back pain, myalgias and neck pain.  Skin: Negative  for color change, pallor and rash.  Neurological: Negative for dizziness, syncope, weakness and headaches.  Hematological: Negative for adenopathy.     Vital Signs: Today's Vitals   07/12/19 1736 07/12/19 1740 07/12/19 1852  BP:  118/76   Pulse:  69   Resp:  18   Temp:  98.9 F (37.2 C)   TempSrc:  Oral   SpO2:  99%   Weight: 157 lb (71.2 kg)    Height: 5\' 3"  (1.6 m)      PainSc: 0-No pain  0-No pain    Physical Exam: Physical Exam  Constitutional: She is oriented to person, place, and time and well-developed, well-nourished, and in no distress.  HENT:  Head: Normocephalic and atraumatic.  Eyes: Pupils are equal, round, and reactive to light.  Cardiovascular: Normal rate, regular rhythm, normal heart sounds and intact distal pulses.  Pulmonary/Chest: No accessory muscle usage. No tachypnea. No respiratory distress. She has wheezes (scattered expiratory). She has no rhonchi.  (+) cough in clinic; non-productive. No SOB or increased WOB. Wheezing appreciated with auscultation only. No stridor. SPO2 99% on RA.   Musculoskeletal:     Cervical back: Normal range of motion and neck supple.  Lymphadenopathy:    She has no cervical adenopathy.  Neurological: She is alert and oriented to person, place, and time. Gait normal.  Skin: Skin is warm and dry. No rash noted. She is not diaphoretic.  Psychiatric: Mood, memory, affect and judgment normal.  Nursing note and vitals reviewed.   Urgent Care Treatments / Results:   LABS: PLEASE NOTE: all labs that were ordered this encounter are listed, however only abnormal results are displayed. Labs Reviewed  NOVEL CORONAVIRUS, NAA (HOSP ORDER, SEND-OUT TO REF LAB; TAT 18-24 HRS)    EKG: -None  RADIOLOGY: DG Chest 2 View  Result Date: 07/12/2019 CLINICAL DATA:  Cough EXAM: CHEST - 2 VIEW COMPARISON:  None. FINDINGS: Heart and mediastinal contours are within normal limits. No focal opacities or effusions. No acute bony abnormality. IMPRESSION: Normal study. Electronically Signed   By: 07/14/2019 M.D.   On: 07/12/2019 18:35    PROCEDURES: Procedures  MEDICATIONS RECEIVED THIS VISIT: Medications - No data to display  PERTINENT CLINICAL COURSE NOTES/UPDATES:   Initial Impression / Assessment and Plan / Urgent Care Course:  Pertinent labs & imaging results that were available during my care of the patient  were personally reviewed by me and considered in my medical decision making (see lab/imaging section of note for values and interpretations).  Holly Watson is a 23 y.o. female who presents to [facility] today with complaints of Cough   Patient is well appearing overall in clinic today. She does not appear to be in any acute distress. Presenting symptoms (see HPI) and exam as documented above. Symptoms persistent x 1 year. (+) cough and wheezing in clinic. Radiographs of the chest performed today revealed no acute cardiopulmonary process; no evidence of peribronchial thickening, areas of consolidation, or focal infiltrates. Reviewed potential differentials including atypical infection not seen on plain films, mold exposure, asthma, allergies, acid reflux, and negative sequela associated with daily smoking. Discussed smoking cessation as a means of improving her cough. Patient would benefit from formal PFTs and evaluation by pulmonologist given the chronicity of her symptoms. She has been referred to PCCM at [facility], however due to insurance issues, they would not see her per her report. Name and office contact information provided on today's AVS for alternative practices that may  be able to help her (Dr. Belia Heman at [facility] pulmonology and Dr. Welton Flakes at [facility]). Patient advised the she will need to contact the office to schedule an appointment to be seen. In the interim, will treat patient with a systemic steroid taper and SABA (albuterol) MDI. Will also send in a supply of benzonatate (Tessalon) for patient to use on a PRN basis to help with her cough.   Additionally, in the wake of the current pandemic, I think it is prudent to test the patient for SARS-CoV-2 (novel coronavirus). Discussed typical symptom constellation. Reviewed potential for infection and need for testing. Patient amenable to being tested. SARS-CoV-2 swab collected by certified clinical staff. Discussed variable turn  around times associated with testing, as swabs are being processed at [facility], and have been taking between 2-5 days to come back. She was advised to self quarantine, per [facility] DHHS guidelines, until negative results received. These measures are being implemented out of an abundance of caution to prevent transmission and spread during the current SARS-CoV-2 pandemic.  Discussed follow up with primary care physician in 1 week for re-evaluation. I have reviewed the follow up and strict return precautions for any new or worsening symptoms. Patient is aware of symptoms that would be deemed urgent/emergent, and would thus require further evaluation either here or in the emergency department. At the time of discharge, she verbalized understanding and consent with the discharge plan as it was reviewed with her. All questions were fielded by provider and/or clinic staff prior to patient discharge.    Final Clinical Impressions / Urgent Care Diagnoses:   Final diagnoses:  Chronic cough  Smoking  Encounter for laboratory testing for COVID-19 virus    New Prescriptions:  [place] Controlled Substance Registry consulted? Not Applicable  Meds ordered this encounter  Medications  . predniSONE (STERAPRED UNI-PAK 21 TAB) 10 MG (21) TBPK tablet    Sig: Take by mouth daily. 60 mg x 1 day, 50 mg x 1 day, 40 mg x 1 day, 30 mg x 1 day, 20 mg x 1 day, 10 mg x 1 day    Dispense:  21 tablet    Refill:  0  . benzonatate (TESSALON) 200 MG capsule    Sig: Take 1 capsule (200 mg total) by mouth 3 (three) times daily as needed for cough.    Dispense:  30 capsule    Refill:  0  . albuterol (VENTOLIN HFA) 108 (90 Base) MCG/ACT inhaler    Sig: Inhale 2 puffs into the lungs every 4 (four) hours as needed for wheezing or shortness of breath.    Dispense:  18 g    Refill:  0    Recommended Follow up Care:  Patient encouraged to follow up with the following provider within the specified time frame, or sooner as dictated by the  severity of her symptoms. As always, she was instructed that for any urgent/emergent care needs, she should seek care either here or in the emergency department for more immediate evaluation.  Follow-up Information    Call  Yevonne Pax, MD.   Specialties: Internal Medicine, Pulmonary Disease Why: Need and appt for further evaluation Contact information: [address] [place] [address] [phone]        Call  Erin Fulling, MD.   Specialties: Pulmonary Disease, Cardiology Why: Need and appt for further evaluation Contact information: [address] [place] [address] [phone]         NOTE: This note  was prepared using Scientist, clinical (histocompatibility and immunogenetics)Dragon dictation software along with smaller Lobbyistphrase technology. Despite my best ability to proofread, there is the potential that transcriptional errors may still occur from this process, and are completely unintentional.    Verlee MonteGray, Deirdra Heumann E, NP 07/13/19 1017

## 2021-04-14 ENCOUNTER — Encounter: Payer: Self-pay | Admitting: Emergency Medicine

## 2021-04-14 ENCOUNTER — Ambulatory Visit
Admission: EM | Admit: 2021-04-14 | Discharge: 2021-04-14 | Disposition: A | Discharge status: Home / Self Care | Payer: Medicaid Other | Attending: Family Medicine | Admitting: Family Medicine

## 2021-04-14 ENCOUNTER — Other Ambulatory Visit: Payer: Self-pay

## 2021-04-14 DIAGNOSIS — R059 Cough, unspecified: Secondary | ICD-10-CM | POA: Diagnosis not present

## 2021-04-14 DIAGNOSIS — J069 Acute upper respiratory infection, unspecified: Secondary | ICD-10-CM

## 2021-04-14 MED ORDER — BENZONATATE 100 MG PO CAPS: ORAL_CAPSULE | ORAL | 0 refills | Status: DC

## 2021-04-14 NOTE — ED Triage Notes (Signed)
Cough x 1 week

## 2021-04-14 NOTE — Discharge Instructions (Addendum)
You have been tested for COVID-19 today. °If your test returns positive, you will receive a phone call from [facility] regarding your results. °Negative test results are not called. °Both positive and negative results area always visible on MyChart. °If you do not have a MyChart account, sign up instructions are provided in your discharge papers. °Please do not hesitate to contact us should you have questions or concerns. ° °

## 2021-04-14 NOTE — ED Provider Notes (Signed)
  [facility]   [id number] 04/14/21 Arrival Time: 1512  ASSESSMENT & PLAN:  1. Cough   2. Viral URI with cough    Discussed typical duration of viral illnesses. COVID-19 testing sent. OTC symptom care as needed.  Meds ordered this encounter  Medications   benzonatate (TESSALON) 100 MG capsule    Sig: Take 1 capsule by mouth every 8 (eight) hours for cough.    Dispense:  21 capsule    Refill:  0     Follow-up Information     [facility]   Why: If worsening or failing to improve as anticipated. Contact information: [address] [place] [address] [phone]                 Reviewed expectations re: course of current medical issues. Questions answered. Outlined signs and symptoms indicating need for more acute intervention. Understanding verbalized. After Visit Summary given.   SUBJECTIVE: History from: patient. Holly Watson is a 25 y.o. female who presents with worries regarding COVID-19. Known COVID-19 contact: unsure. Recent travel: none. Reports: cough; several days. Denies: fever and difficulty breathing. Normal PO intake without n/v/d.   OBJECTIVE:  Vitals:   04/14/21 1549  BP: 115/76  Pulse: 91  Resp: 18  Temp: 97.9 F (36.6 C)  TempSrc: Oral  SpO2: 97%    General appearance: alert; no distress Eyes: PERRLA; EOMI; conjunctiva normal HENT: [place]; AT; with mild nasal congestion Neck: supple  Lungs: speaks full sentences without difficulty; unlabored; clear Extremities: no edema Skin: warm and dry Neurologic: normal gait Psychological: alert and cooperative; normal mood and affect  Labs:  Labs Reviewed  NOVEL CORONAVIRUS, NAA      No Known Allergies  Past Medical History:  Diagnosis Date   Medical history non-contributory    Social History   Socioeconomic History   Marital status: Single    Spouse name: Not on file   Number of children: Not on file   Years of education: Not on file    Highest education level: Not on file  Occupational History   Not on file  Tobacco Use   Smoking status: Every Day    Packs/day: 1.00    Types: Cigarettes   Smokeless tobacco: Never   Tobacco comments:    "states smoked last pack and not bought any"  Vaping Use   Vaping Use: Never used  Substance and Sexual Activity   Alcohol use: No   Drug use: Not Currently    Types: Marijuana    Comment: stopped about 1 month ago   Sexual activity: Yes    Birth control/protection: Other-see comments, I.U.D.  Other Topics Concern   Not on file  Social History Narrative   Not on file   Social Determinants of Health   Financial Resource Strain: Not on file  Food Insecurity: Not on file  Transportation Needs: Not on file  Physical Activity: Not on file  Stress: Not on file  Social Connections: Not on file  Intimate Partner Violence: Not on file   Family History  Problem Relation Age of Onset   Healthy Mother    Healthy Father    Past Surgical History:  Procedure Laterality Date   NO PAST SURGERIES       Mardella Layman, MD 04/14/21 1949

## 2021-04-15 LAB — NOVEL CORONAVIRUS, NAA: SARS-CoV-2, NAA: NOT DETECTED

## 2021-04-15 LAB — SARS-COV-2, NAA 2 DAY TAT

## 2021-08-17 ENCOUNTER — Other Ambulatory Visit: Payer: Self-pay

## 2021-08-17 ENCOUNTER — Ambulatory Visit
Admission: RE | Admit: 2021-08-17 | Discharge: 2021-08-17 | Disposition: A | Discharge status: Home / Self Care | Payer: Medicaid Other | Source: Ambulatory Visit

## 2021-08-17 VITALS — BP 121/83 | HR 90 | Temp 99.0°F | Resp 18 | Ht 63.0 in | Wt 137.0 lb

## 2021-08-17 DIAGNOSIS — M940 Chondrocostal junction syndrome [Tietze]: Secondary | ICD-10-CM | POA: Diagnosis not present

## 2021-08-17 MED ORDER — PREDNISONE 10 MG (21) PO TBPK
ORAL_TABLET | ORAL | 0 refills | Status: AC
Start: 2021-08-17 — End: ?

## 2021-08-17 NOTE — Discharge Instructions (Signed)
Take the Prednisone as directed starting tomorrow morning. Take it with breakfast.  Apply moist heat to your chest wall for 20 minutes at a time, 2-3 times a day to improve blood flow and help aid in healing.  Stop smoking.

## 2021-08-17 NOTE — ED Provider Notes (Signed)
[facility]    CSN: [id number] Arrival date & time: 08/17/21  1752      History   Chief Complaint Chief Complaint  Patient presents with   Muscle Pain    Left upper chest.     HPI Holly Watson is a 26 y.o. female.   HPI  25 year old female here for evaluation of chest wall pain.  Patient ports that she is been experiencing pain in the left side of her chest wall for a week or more.  She states that here lately it has been getting worse.  She states that she is unaware of any particular injury that provoked the pain but she has been doing a lot of heavy lifting.  Patient is also a smoker and she also recently bought a vape.  She states that the pain increases with movement and deep respirations.  She does have a cough but this is not new.  She denies any shortness of breath.  She has been using topical balm's without any improvement of symptoms.  Past Medical History:  Diagnosis Date   Medical history non-contributory     Patient Active Problem List   Diagnosis Date Noted   Intrauterine growth restriction affecting antepartum care of mother in third trimester 03/26/2017   Labor and delivery indication for care or intervention 03/24/2017   Pregnancy 03/12/2017   Cigarette smoker 01/20/2017   Poor fetal growth affecting pregnancy in third trimester, antepartum 06/30/2015    Past Surgical History:  Procedure Laterality Date   NO PAST SURGERIES      OB History     Gravida  2   Para  2   Term      Preterm  2   AB      Living  2      SAB      IAB      Ectopic      Multiple  0   Live Births  2            Home Medications    Prior to Admission medications   Medication Sig Start Date End Date Taking? Authorizing Provider  doxycycline (MONODOX) 100 MG capsule Take 100 mg by mouth 2 (two) times daily. 08/15/21  Yes [provider]  escitalopram (LEXAPRO) 10 MG tablet Take 10 mg by mouth daily. 07/30/21  Yes [provider]  metroNIDAZOLE (FLAGYL) 500 MG tablet Take 500 mg by mouth 2 (two) times daily. 08/10/21  Yes [provider]  predniSONE (STERAPRED UNI-PAK 21 TAB) 10 MG (21) TBPK tablet Take 6 tablets on day 1, 5 tablets day 2, 4 tablets day 3, 3 tablets day 4, 2 tablets day 5, 1 tablet day 6 08/17/21  Yes Margarette Canada, NP    Family History Family History  Problem Relation Age of Onset   Healthy Mother    Healthy Father     Social History Social History   Tobacco Use   Smoking status: Every Day    Packs/day: 1.00    Types: Cigarettes   Smokeless tobacco: Never   Tobacco comments:    "states smoked last pack and not bought any"  Vaping Use   Vaping Use: Never used  Substance Use Topics   Alcohol use: No   Drug use: Not Currently    Types: Marijuana    Comment: stopped about 1 month ago     Allergies   Patient has no known allergies.   Review of Systems Review  of Systems  Constitutional:  Negative for diaphoresis and fever.  Respiratory:  Positive for cough. Negative for shortness of breath and wheezing.   Cardiovascular:  Positive for chest pain.  Gastrointestinal:  Negative for nausea.  Hematological: Negative.   Psychiatric/Behavioral: Negative.      Physical Exam Triage Vital Signs ED Triage Vitals  Enc Vitals Group     BP 08/17/21 1837 121/83     Pulse Rate 08/17/21 1837 90     Resp 08/17/21 1837 18     Temp 08/17/21 1837 99 F (37.2 C)     Temp Source 08/17/21 1837 Oral     SpO2 08/17/21 1837 97 %     Weight 08/17/21 1836 137 lb (62.1 kg)     Height 08/17/21 1836 5\' 3"  (1.6 m)     Head Circumference --      Peak Flow --      Pain Score 08/17/21 1836 5     Pain Loc --      Pain Edu? --      Excl. in [place]? --    No data found.  Updated Vital Signs BP 121/83 (BP Location: Right Arm)    Pulse 90    Temp 99 F (37.2 C) (Oral)    Resp 18    Ht 5\' 3"  (1.6 m)    Wt 137 lb (62.1 kg)    SpO2 97%    BMI 24.27 kg/m   Visual Acuity Right Eye  Distance:   Left Eye Distance:   Bilateral Distance:    Right Eye Near:   Left Eye Near:    Bilateral Near:     Physical Exam Vitals and nursing note reviewed.  Constitutional:      General: She is not in acute distress.    Appearance: Normal appearance. She is not ill-appearing.  HENT:     Head: Normocephalic and atraumatic.  Cardiovascular:     Rate and Rhythm: Normal rate and regular rhythm.     Pulses: Normal pulses.     Heart sounds: Normal heart sounds. No murmur heard.   No friction rub. No gallop.  Pulmonary:     Effort: Pulmonary effort is normal.     Breath sounds: Normal breath sounds. No wheezing, rhonchi or rales.  Chest:     Chest wall: Tenderness present.  Skin:    General: Skin is warm and dry.     Capillary Refill: Capillary refill takes less than 2 seconds.     Findings: No bruising or erythema.  Neurological:     General: No focal deficit present.     Mental Status: She is alert and oriented to person, place, and time.  Psychiatric:        Mood and Affect: Mood normal.        Behavior: Behavior normal.        Thought Content: Thought content normal.        Judgment: Judgment normal.     UC Treatments / Results  Labs (all labs ordered are listed, but only abnormal results are displayed) Labs Reviewed - No data to display  EKG   Radiology No results found.  Procedures Procedures (including critical care time)  Medications Ordered in UC Medications - No data to display  Initial Impression / Assessment and Plan / UC Course  I have reviewed the triage vital signs and the nursing notes.  Pertinent labs & imaging results that were available during my care of the patient were reviewed  by me and considered in my medical decision making (see chart for details).  Patient is a nontoxic-appearing 26 year old female here for evaluation of left-sided chest wall pain as outlined in HPI above.  Patient is in no acute distress and she has normal chest  excursion with respiration.  She is able to speak in full sentences and does not demonstrate dyspnea or tachypnea.  Physical exam reveals a benign cardiopulmonary exam with S1-S2 heart sounds lung sounds are clear to auscultation all fields.  Patient does have tenderness when palpating the superior aspect of the left chest wall and when palpating the costosternal cartilage of the left chest wall.  There is no overlying erythema, ecchymosis, or edema.  Patient's exam is consistent with musculoskeletal chest wall pain.  She states that she has been taking ibuprofen at home without any improvement of symptoms.  We will try a prednisone Dosepak.  I have also advised the patient to avoid any heavy lifting and to apply moist heat to her chest wall to help improve circulation to facilitate healing.  Work note provided.   Final Clinical Impressions(s) / UC Diagnoses   Final diagnoses:  Costochondritis     Discharge Instructions      Take the Prednisone as directed starting tomorrow morning. Take it with breakfast.  Apply moist heat to your chest wall for 20 minutes at a time, 2-3 times a day to improve blood flow and help aid in healing.  Stop smoking.     ED Prescriptions     Medication Sig Dispense Auth. Provider   predniSONE (STERAPRED UNI-PAK 21 TAB) 10 MG (21) TBPK tablet Take 6 tablets on day 1, 5 tablets day 2, 4 tablets day 3, 3 tablets day 4, 2 tablets day 5, 1 tablet day 6 21 tablet Margarette Canada, NP      PDMP not reviewed this encounter.   Margarette Canada, NP 08/17/21 [phone]

## 2021-08-17 NOTE — ED Triage Notes (Signed)
Pt here with C/O left chest pulled muscle for 2 weeks, states that she feels like it is getting worst. Pt states it started right after she got her vape.
